# Patient Record
Sex: Female | Born: 1945
Health system: Southern US, Community
[De-identification: ages and names within clinical notes are randomized; demographics above are authoritative.]

## PROBLEM LIST (undated history)

## (undated) DIAGNOSIS — M810 Age-related osteoporosis without current pathological fracture: Secondary | ICD-10-CM

## (undated) DIAGNOSIS — H409 Unspecified glaucoma: Secondary | ICD-10-CM

## (undated) DIAGNOSIS — J449 Chronic obstructive pulmonary disease, unspecified: Secondary | ICD-10-CM

## (undated) DIAGNOSIS — I10 Essential (primary) hypertension: Secondary | ICD-10-CM

## (undated) HISTORY — PX: VAGINAL HYSTERECTOMY: SUR661

## (undated) HISTORY — DX: Chronic obstructive pulmonary disease, unspecified: J44.9

## (undated) HISTORY — DX: Unspecified glaucoma: H40.9

## (undated) HISTORY — DX: Age-related osteoporosis without current pathological fracture: M81.0

## (undated) HISTORY — DX: Essential (primary) hypertension: I10

## (undated) HISTORY — PX: FOOT SURGERY: SHX648

---

## 2007-01-24 ENCOUNTER — Ambulatory Visit: Payer: Self-pay | Admitting: Family Medicine

## 2007-01-30 ENCOUNTER — Ambulatory Visit: Payer: Self-pay | Admitting: Family Medicine

## 2008-04-20 ENCOUNTER — Ambulatory Visit: Payer: Self-pay | Admitting: Family Medicine

## 2008-09-15 ENCOUNTER — Ambulatory Visit: Payer: Self-pay | Admitting: Family Medicine

## 2009-10-07 ENCOUNTER — Ambulatory Visit: Payer: Self-pay | Admitting: Family Medicine

## 2011-05-25 ENCOUNTER — Ambulatory Visit: Payer: Self-pay | Admitting: Family Medicine

## 2011-10-02 DIAGNOSIS — H4011X0 Primary open-angle glaucoma, stage unspecified: Secondary | ICD-10-CM | POA: Diagnosis not present

## 2012-02-15 DIAGNOSIS — Z23 Encounter for immunization: Secondary | ICD-10-CM | POA: Diagnosis not present

## 2012-02-15 DIAGNOSIS — M810 Age-related osteoporosis without current pathological fracture: Secondary | ICD-10-CM | POA: Diagnosis not present

## 2012-02-15 DIAGNOSIS — I10 Essential (primary) hypertension: Secondary | ICD-10-CM | POA: Diagnosis not present

## 2012-02-15 DIAGNOSIS — J449 Chronic obstructive pulmonary disease, unspecified: Secondary | ICD-10-CM | POA: Diagnosis not present

## 2012-02-15 DIAGNOSIS — J309 Allergic rhinitis, unspecified: Secondary | ICD-10-CM | POA: Diagnosis not present

## 2012-03-28 DIAGNOSIS — H4011X0 Primary open-angle glaucoma, stage unspecified: Secondary | ICD-10-CM | POA: Diagnosis not present

## 2012-09-26 DIAGNOSIS — H4011X0 Primary open-angle glaucoma, stage unspecified: Secondary | ICD-10-CM | POA: Diagnosis not present

## 2012-10-23 DIAGNOSIS — T1500XA Foreign body in cornea, unspecified eye, initial encounter: Secondary | ICD-10-CM | POA: Diagnosis not present

## 2012-10-23 DIAGNOSIS — H5710 Ocular pain, unspecified eye: Secondary | ICD-10-CM | POA: Diagnosis not present

## 2012-11-08 DIAGNOSIS — J309 Allergic rhinitis, unspecified: Secondary | ICD-10-CM | POA: Diagnosis not present

## 2012-11-08 DIAGNOSIS — M810 Age-related osteoporosis without current pathological fracture: Secondary | ICD-10-CM | POA: Diagnosis not present

## 2012-11-08 DIAGNOSIS — I10 Essential (primary) hypertension: Secondary | ICD-10-CM | POA: Diagnosis not present

## 2012-11-08 DIAGNOSIS — R197 Diarrhea, unspecified: Secondary | ICD-10-CM | POA: Diagnosis not present

## 2012-11-08 DIAGNOSIS — J449 Chronic obstructive pulmonary disease, unspecified: Secondary | ICD-10-CM | POA: Diagnosis not present

## 2012-11-15 DIAGNOSIS — J328 Other chronic sinusitis: Secondary | ICD-10-CM | POA: Diagnosis not present

## 2012-11-15 DIAGNOSIS — R0982 Postnasal drip: Secondary | ICD-10-CM | POA: Diagnosis not present

## 2012-11-15 DIAGNOSIS — J301 Allergic rhinitis due to pollen: Secondary | ICD-10-CM | POA: Diagnosis not present

## 2012-11-15 DIAGNOSIS — H6120 Impacted cerumen, unspecified ear: Secondary | ICD-10-CM | POA: Diagnosis not present

## 2013-01-15 DIAGNOSIS — Z23 Encounter for immunization: Secondary | ICD-10-CM | POA: Diagnosis not present

## 2013-03-27 DIAGNOSIS — H4011X0 Primary open-angle glaucoma, stage unspecified: Secondary | ICD-10-CM | POA: Diagnosis not present

## 2013-04-22 DIAGNOSIS — H2510 Age-related nuclear cataract, unspecified eye: Secondary | ICD-10-CM | POA: Diagnosis not present

## 2013-07-24 DIAGNOSIS — Z0000 Encounter for general adult medical examination without abnormal findings: Secondary | ICD-10-CM | POA: Diagnosis not present

## 2013-07-24 DIAGNOSIS — I10 Essential (primary) hypertension: Secondary | ICD-10-CM | POA: Diagnosis not present

## 2013-07-24 DIAGNOSIS — R634 Abnormal weight loss: Secondary | ICD-10-CM | POA: Diagnosis not present

## 2013-07-24 DIAGNOSIS — M810 Age-related osteoporosis without current pathological fracture: Secondary | ICD-10-CM | POA: Diagnosis not present

## 2013-07-24 DIAGNOSIS — J449 Chronic obstructive pulmonary disease, unspecified: Secondary | ICD-10-CM | POA: Diagnosis not present

## 2013-07-24 DIAGNOSIS — R109 Unspecified abdominal pain: Secondary | ICD-10-CM | POA: Diagnosis not present

## 2013-08-08 ENCOUNTER — Ambulatory Visit: Payer: Self-pay | Admitting: Family Medicine

## 2013-08-08 DIAGNOSIS — R634 Abnormal weight loss: Secondary | ICD-10-CM | POA: Diagnosis not present

## 2013-08-08 DIAGNOSIS — N200 Calculus of kidney: Secondary | ICD-10-CM | POA: Diagnosis not present

## 2013-08-08 DIAGNOSIS — N949 Unspecified condition associated with female genital organs and menstrual cycle: Secondary | ICD-10-CM | POA: Diagnosis not present

## 2013-08-08 DIAGNOSIS — Z90710 Acquired absence of both cervix and uterus: Secondary | ICD-10-CM | POA: Diagnosis not present

## 2013-08-08 DIAGNOSIS — R109 Unspecified abdominal pain: Secondary | ICD-10-CM | POA: Diagnosis not present

## 2013-09-04 ENCOUNTER — Ambulatory Visit: Payer: Self-pay | Admitting: Family Medicine

## 2013-09-04 DIAGNOSIS — J449 Chronic obstructive pulmonary disease, unspecified: Secondary | ICD-10-CM | POA: Diagnosis not present

## 2013-09-04 DIAGNOSIS — R911 Solitary pulmonary nodule: Secondary | ICD-10-CM | POA: Diagnosis not present

## 2013-09-04 DIAGNOSIS — R634 Abnormal weight loss: Secondary | ICD-10-CM | POA: Diagnosis not present

## 2013-09-04 DIAGNOSIS — A048 Other specified bacterial intestinal infections: Secondary | ICD-10-CM | POA: Diagnosis not present

## 2013-09-04 DIAGNOSIS — R109 Unspecified abdominal pain: Secondary | ICD-10-CM | POA: Diagnosis not present

## 2013-09-09 DIAGNOSIS — R109 Unspecified abdominal pain: Secondary | ICD-10-CM | POA: Diagnosis not present

## 2013-09-09 DIAGNOSIS — R634 Abnormal weight loss: Secondary | ICD-10-CM | POA: Insufficient documentation

## 2013-09-09 DIAGNOSIS — Z720 Tobacco use: Secondary | ICD-10-CM | POA: Insufficient documentation

## 2013-09-09 DIAGNOSIS — R911 Solitary pulmonary nodule: Secondary | ICD-10-CM | POA: Insufficient documentation

## 2013-09-09 DIAGNOSIS — R933 Abnormal findings on diagnostic imaging of other parts of digestive tract: Secondary | ICD-10-CM | POA: Diagnosis not present

## 2013-09-25 DIAGNOSIS — H4011X0 Primary open-angle glaucoma, stage unspecified: Secondary | ICD-10-CM | POA: Diagnosis not present

## 2014-01-23 DIAGNOSIS — F17211 Nicotine dependence, cigarettes, in remission: Secondary | ICD-10-CM | POA: Diagnosis not present

## 2014-01-23 DIAGNOSIS — J449 Chronic obstructive pulmonary disease, unspecified: Secondary | ICD-10-CM | POA: Diagnosis not present

## 2014-01-23 DIAGNOSIS — M810 Age-related osteoporosis without current pathological fracture: Secondary | ICD-10-CM | POA: Diagnosis not present

## 2014-01-23 DIAGNOSIS — I10 Essential (primary) hypertension: Secondary | ICD-10-CM | POA: Diagnosis not present

## 2014-01-30 DIAGNOSIS — Z23 Encounter for immunization: Secondary | ICD-10-CM | POA: Diagnosis not present

## 2014-03-26 DIAGNOSIS — H4011X1 Primary open-angle glaucoma, mild stage: Secondary | ICD-10-CM | POA: Diagnosis not present

## 2014-08-10 DIAGNOSIS — J42 Unspecified chronic bronchitis: Secondary | ICD-10-CM | POA: Diagnosis not present

## 2014-08-10 DIAGNOSIS — J441 Chronic obstructive pulmonary disease with (acute) exacerbation: Secondary | ICD-10-CM | POA: Diagnosis not present

## 2014-08-10 DIAGNOSIS — M810 Age-related osteoporosis without current pathological fracture: Secondary | ICD-10-CM | POA: Diagnosis not present

## 2014-08-10 DIAGNOSIS — I10 Essential (primary) hypertension: Secondary | ICD-10-CM | POA: Diagnosis not present

## 2014-10-08 ENCOUNTER — Other Ambulatory Visit: Payer: Self-pay | Admitting: Family Medicine

## 2014-10-08 DIAGNOSIS — J439 Emphysema, unspecified: Secondary | ICD-10-CM

## 2014-10-09 DIAGNOSIS — H2513 Age-related nuclear cataract, bilateral: Secondary | ICD-10-CM | POA: Diagnosis not present

## 2014-10-09 DIAGNOSIS — H4011X4 Primary open-angle glaucoma, indeterminate stage: Secondary | ICD-10-CM | POA: Diagnosis not present

## 2014-11-13 DIAGNOSIS — H4011X4 Primary open-angle glaucoma, indeterminate stage: Secondary | ICD-10-CM | POA: Diagnosis not present

## 2014-11-13 DIAGNOSIS — H2513 Age-related nuclear cataract, bilateral: Secondary | ICD-10-CM | POA: Diagnosis not present

## 2015-01-25 ENCOUNTER — Encounter: Payer: Self-pay | Admitting: Family Medicine

## 2015-01-25 ENCOUNTER — Ambulatory Visit (INDEPENDENT_AMBULATORY_CARE_PROVIDER_SITE_OTHER): Payer: Medicare Other | Admitting: Family Medicine

## 2015-01-25 VITALS — BP 100/80 | HR 80 | Ht 65.0 in | Wt 83.0 lb

## 2015-01-25 DIAGNOSIS — J432 Centrilobular emphysema: Secondary | ICD-10-CM

## 2015-01-25 DIAGNOSIS — M810 Age-related osteoporosis without current pathological fracture: Secondary | ICD-10-CM | POA: Diagnosis not present

## 2015-01-25 DIAGNOSIS — I10 Essential (primary) hypertension: Secondary | ICD-10-CM

## 2015-01-25 DIAGNOSIS — J439 Emphysema, unspecified: Secondary | ICD-10-CM | POA: Diagnosis not present

## 2015-01-25 MED ORDER — ALENDRONATE SODIUM 70 MG PO TABS: 70.0000 mg | ORAL_TABLET | ORAL | Status: DC

## 2015-01-25 MED ORDER — ALBUTEROL SULFATE HFA 108 (90 BASE) MCG/ACT IN AERS: 2.0000 | INHALATION_SPRAY | RESPIRATORY_TRACT | Status: DC | PRN

## 2015-01-25 MED ORDER — HYDROCHLOROTHIAZIDE 12.5 MG PO TABS: 12.5000 mg | ORAL_TABLET | Freq: Every day | ORAL | Status: DC

## 2015-01-25 MED ORDER — BUDESONIDE-FORMOTEROL FUMARATE 160-4.5 MCG/ACT IN AERO: 1.0000 | INHALATION_SPRAY | Freq: Every day | RESPIRATORY_TRACT | Status: DC

## 2015-01-25 MED ORDER — TIOTROPIUM BROMIDE MONOHYDRATE 18 MCG IN CAPS
ORAL_CAPSULE | RESPIRATORY_TRACT | Status: DC
Start: 2015-01-25 — End: 2015-07-09

## 2015-01-25 NOTE — Progress Notes (Signed)
Name: Nichole Henry   MRN: [id number]    DOB: November 22, 1945   Date:01/25/2015       Progress Note  Subjective  Chief Complaint  Chief Complaint  Patient presents with  . Hypertension  . COPD  . Osteoporosis    Hypertension This is a chronic problem. The current episode started more than 1 year ago. The problem has been gradually improving since onset. The problem is controlled. Pertinent negatives include no anxiety, blurred vision, chest pain, headaches, malaise/fatigue, neck pain, orthopnea, palpitations, peripheral edema, PND, shortness of breath or sweats. There are no associated agents to hypertension. Risk factors for coronary artery disease include smoking/tobacco exposure. Past treatments include diuretics. The current treatment provides moderate improvement. There are no compliance problems.  There is no history of angina, kidney disease, CAD/MI, CVA, heart failure, left ventricular hypertrophy, PVD, renovascular disease or retinopathy. There is no history of chronic renal disease.  Shortness of Breath This is a chronic problem. The current episode started in the past 7 days. The problem occurs intermittently. Pertinent negatives include no abdominal pain, chest pain, claudication, coryza, ear pain, fever, headaches, hemoptysis, leg pain, leg swelling, neck pain, orthopnea, PND, rash, rhinorrhea, sore throat, sputum production, swollen glands, syncope, vomiting or wheezing. The symptoms are aggravated by pollens and weather changes. She has tried beta agonist inhalers and steroid inhalers for the symptoms. The treatment provided mild relief. Her past medical history is significant for allergies. There is no history of aspirin allergies, asthma, bronchiolitis, CAD, chronic lung disease, COPD, DVT, a heart failure, PE, pneumonia or a recent surgery.    No problem-specific assessment & plan notes found for this encounter.   Past Medical History  Diagnosis Date  . Hypertension   .  Osteoporosis   . Glaucoma   . COPD (chronic obstructive pulmonary disease) [facility])     Past Surgical History  Procedure Laterality Date  . Vaginal hysterectomy    . Foot surgery Right     realigned foot     Family History  Problem Relation Age of Onset  . Hypertension Mother   . Stroke Mother     Social History   Social History  . Marital Status: Single    Spouse Name: N/A  . Number of Children: N/A  . Years of Education: N/A   Occupational History  . Not on file.   Social History Main Topics  . Smoking status: Current Every Day Smoker  . Smokeless tobacco: Not on file  . Alcohol Use: No  . Drug Use: No  . Sexual Activity: Not Currently   Other Topics Concern  . Not on file   Social History Narrative  . No narrative on file    No Known Allergies   Review of Systems  Constitutional: Negative for fever, chills, weight loss and malaise/fatigue.  HENT: Negative for ear discharge, ear pain, rhinorrhea and sore throat.   Eyes: Negative for blurred vision.  Respiratory: Negative for cough, hemoptysis, sputum production, shortness of breath and wheezing.   Cardiovascular: Negative for chest pain, palpitations, orthopnea, claudication, leg swelling, syncope and PND.  Gastrointestinal: Negative for heartburn, nausea, vomiting, abdominal pain, diarrhea, constipation, blood in stool and melena.  Genitourinary: Negative for dysuria, urgency, frequency and hematuria.  Musculoskeletal: Negative for myalgias, back pain, joint pain and neck pain.  Skin: Negative for rash.  Neurological: Negative for dizziness, tingling, sensory change, focal weakness and headaches.  Endo/Heme/Allergies: Negative for environmental allergies and polydipsia. Does not bruise/bleed easily.  Psychiatric/Behavioral:  Negative for depression and suicidal ideas. The patient is not nervous/anxious and does not have insomnia.      Objective  Filed Vitals:   01/25/15 1447  BP: 100/80  Pulse: 80   Height: 5\' 5"  (1.651 m)  Weight: 83 lb (37.649 kg)    Physical Exam  Constitutional: She is well-developed, well-nourished, and in no distress. No distress.  HENT:  Head: Normocephalic and atraumatic.  Right Ear: External ear normal.  Left Ear: External ear normal.  Nose: Nose normal.  Mouth/Throat: Oropharynx is clear and moist.  Eyes: Conjunctivae and EOM are normal. Pupils are equal, round, and reactive to light. Right eye exhibits no discharge. Left eye exhibits no discharge.  Neck: Normal range of motion. Neck supple. No JVD present. No thyromegaly present.  Cardiovascular: Normal rate, regular rhythm, normal heart sounds and intact distal pulses.  Exam reveals no gallop and no friction rub.   No murmur heard. Pulmonary/Chest: Effort normal and breath sounds normal.  Abdominal: Soft. Bowel sounds are normal. She exhibits no mass. There is no tenderness. There is no guarding.  Musculoskeletal: Normal range of motion. She exhibits no edema.  Lymphadenopathy:    She has no cervical adenopathy.  Neurological: She is alert. She has normal reflexes.  Skin: Skin is warm and dry. She is not diaphoretic.  Psychiatric: Mood and affect normal.      Assessment & Plan  Problem List Items Addressed This Visit      Cardiovascular and Mediastinum   Essential hypertension - Primary   Relevant Medications   hydrochlorothiazide (HYDRODIURIL) 12.5 MG tablet   Other Relevant Orders   Renal Function Panel     Respiratory   Centrilobular emphysema (HCC)   Relevant Medications   promethazine (PHENERGAN) 25 MG tablet   albuterol (PROAIR HFA) 108 (90 BASE) MCG/ACT inhaler   budesonide-formoterol (SYMBICORT) 160-4.5 MCG/ACT inhaler   tiotropium (SPIRIVA HANDIHALER) 18 MCG inhalation capsule     Musculoskeletal and Integument   Osteoporosis   Relevant Medications   alendronate (FOSAMAX) 70 MG tablet    Other Visit Diagnoses    Pulmonary emphysema, unspecified emphysema type (HCC)         Relevant Medications    promethazine (PHENERGAN) 25 MG tablet    albuterol (PROAIR HFA) 108 (90 BASE) MCG/ACT inhaler    budesonide-formoterol (SYMBICORT) 160-4.5 MCG/ACT inhaler    tiotropium (SPIRIVA HANDIHALER) 18 MCG inhalation capsule         Dr. Hayden Rasmusseneanna [facility]  01/25/2015

## 2015-01-26 LAB — RENAL FUNCTION PANEL
ALBUMIN: 4.6 g/dL (ref 3.6–4.8)
BUN/Creatinine Ratio: 21 (ref 11–26)
BUN: 12 mg/dL (ref 8–27)
CO2: 26 mmol/L (ref 18–29)
CREATININE: 0.58 mg/dL (ref 0.57–1.00)
Calcium: 9.9 mg/dL (ref 8.7–10.3)
Chloride: 99 mmol/L (ref 97–106)
GFR calc Af Amer: 109 mL/min/{1.73_m2} (ref >59)
GFR, EST NON AFRICAN AMERICAN: 94 mL/min/{1.73_m2} (ref >59)
GLUCOSE: 78 mg/dL (ref 65–99)
PHOSPHORUS: 3.8 mg/dL (ref 2.5–4.5)
POTASSIUM: 4.2 mmol/L (ref 3.5–5.2)
Sodium: 139 mmol/L (ref 136–144)

## 2015-01-29 MED ORDER — BUDESONIDE-FORMOTEROL FUMARATE 160-4.5 MCG/ACT IN AERO: 2.0000 | INHALATION_SPRAY | Freq: Every day | RESPIRATORY_TRACT | Status: DC

## 2015-01-29 NOTE — Addendum Note (Signed)
Addended by: Everitt AmberLYNCH, Dashon Mcintire L on: 01/29/2015 01:53 PM   Modules accepted: Orders

## 2015-02-10 DIAGNOSIS — H2513 Age-related nuclear cataract, bilateral: Secondary | ICD-10-CM | POA: Diagnosis not present

## 2015-02-10 DIAGNOSIS — H401131 Primary open-angle glaucoma, bilateral, mild stage: Secondary | ICD-10-CM | POA: Diagnosis not present

## 2015-02-12 DIAGNOSIS — Z23 Encounter for immunization: Secondary | ICD-10-CM | POA: Diagnosis not present

## 2015-06-09 DIAGNOSIS — H401131 Primary open-angle glaucoma, bilateral, mild stage: Secondary | ICD-10-CM | POA: Diagnosis not present

## 2015-06-09 DIAGNOSIS — H2513 Age-related nuclear cataract, bilateral: Secondary | ICD-10-CM | POA: Diagnosis not present

## 2015-07-09 ENCOUNTER — Ambulatory Visit (INDEPENDENT_AMBULATORY_CARE_PROVIDER_SITE_OTHER): Payer: Medicare Other | Admitting: Family Medicine

## 2015-07-09 ENCOUNTER — Ambulatory Visit
Admission: RE | Admit: 2015-07-09 | Discharge: 2015-07-09 | Disposition: A | Discharge status: Home / Self Care | Payer: Medicare Other | Source: Ambulatory Visit | Attending: Family Medicine | Admitting: Family Medicine

## 2015-07-09 ENCOUNTER — Encounter: Payer: Self-pay | Admitting: Family Medicine

## 2015-07-09 VITALS — BP 138/80 | HR 119 | Ht 64.0 in | Wt 82.0 lb

## 2015-07-09 DIAGNOSIS — I10 Essential (primary) hypertension: Secondary | ICD-10-CM | POA: Diagnosis not present

## 2015-07-09 DIAGNOSIS — Z720 Tobacco use: Secondary | ICD-10-CM

## 2015-07-09 DIAGNOSIS — J439 Emphysema, unspecified: Secondary | ICD-10-CM | POA: Insufficient documentation

## 2015-07-09 DIAGNOSIS — J40 Bronchitis, not specified as acute or chronic: Secondary | ICD-10-CM | POA: Insufficient documentation

## 2015-07-09 DIAGNOSIS — J432 Centrilobular emphysema: Secondary | ICD-10-CM

## 2015-07-09 DIAGNOSIS — R0602 Shortness of breath: Secondary | ICD-10-CM | POA: Diagnosis not present

## 2015-07-09 DIAGNOSIS — R0600 Dyspnea, unspecified: Secondary | ICD-10-CM | POA: Diagnosis present

## 2015-07-09 DIAGNOSIS — Z9189 Other specified personal risk factors, not elsewhere classified: Secondary | ICD-10-CM

## 2015-07-09 DIAGNOSIS — Z87898 Personal history of other specified conditions: Secondary | ICD-10-CM

## 2015-07-09 DIAGNOSIS — J984 Other disorders of lung: Secondary | ICD-10-CM | POA: Insufficient documentation

## 2015-07-09 DIAGNOSIS — J449 Chronic obstructive pulmonary disease, unspecified: Secondary | ICD-10-CM | POA: Diagnosis not present

## 2015-07-09 MED ORDER — AZITHROMYCIN 250 MG PO TABS: ORAL_TABLET | ORAL | Status: DC

## 2015-07-09 MED ORDER — BUDESONIDE-FORMOTEROL FUMARATE 160-4.5 MCG/ACT IN AERO: 2.0000 | INHALATION_SPRAY | Freq: Every day | RESPIRATORY_TRACT | Status: DC

## 2015-07-09 MED ORDER — TIOTROPIUM BROMIDE MONOHYDRATE 18 MCG IN CAPS: ORAL_CAPSULE | RESPIRATORY_TRACT | Status: DC

## 2015-07-09 MED ORDER — ALBUTEROL SULFATE HFA 108 (90 BASE) MCG/ACT IN AERS: 2.0000 | INHALATION_SPRAY | RESPIRATORY_TRACT | Status: DC | PRN

## 2015-07-09 MED ORDER — HYDROCHLOROTHIAZIDE 12.5 MG PO TABS: 12.5000 mg | ORAL_TABLET | Freq: Every day | ORAL | Status: DC

## 2015-07-09 NOTE — Progress Notes (Signed)
Name: Nichole Henry   MRN: [id number]    DOB: [birth year]   Date:07/09/2015       Progress Note  Subjective  Chief Complaint  Chief Complaint  Patient presents with  . COPD  . Hypertension    Hypertension This is a chronic problem. The current episode started more than 1 year ago. The problem has been gradually worsening since onset. The problem is controlled. Pertinent negatives include no anxiety, blurred vision, chest pain, headaches, malaise/fatigue, neck pain, orthopnea, palpitations, peripheral edema, PND, shortness of breath or sweats. There are no associated agents to hypertension. There are no known risk factors for coronary artery disease. Past treatments include diuretics. The current treatment provides mild improvement. There is no history of angina, kidney disease, CAD/MI, CVA, heart failure, left ventricular hypertrophy, PVD, renovascular disease or retinopathy. There is no history of chronic renal disease or a hypertension causing med.  Shortness of Breath This is a chronic problem. The current episode started more than 1 year ago. The problem occurs daily. Associated symptoms include sputum production. Pertinent negatives include no abdominal pain, chest pain, ear pain, fever, headaches, hemoptysis, leg swelling, neck pain, orthopnea, PND, rash, sore throat or wheezing. Nothing aggravates the symptoms. She has tried nothing for the symptoms. The treatment provided mild relief. There is no history of asthma, bronchiolitis, CAD, COPD, DVT, a heart failure, PE or pneumonia.    No problem-specific assessment & plan notes found for this encounter.   Past Medical History  Diagnosis Date  . Hypertension   . Osteoporosis   . Glaucoma   . COPD (chronic obstructive pulmonary disease) [facility](HCC)     Past Surgical History  Procedure Laterality Date  . Vaginal hysterectomy    . Foot surgery Right     realigned foot     Family History  Problem Relation Age of Onset  . Hypertension  Mother   . Stroke Mother     Social History   Social History  . Marital Status: Single    Spouse Name: N/A  . Number of Children: N/A  . Years of Education: N/A   Occupational History  . Not on file.   Social History Main Topics  . Smoking status: Current Every Day Smoker  . Smokeless tobacco: Not on file  . Alcohol Use: No  . Drug Use: No  . Sexual Activity: Not Currently   Other Topics Concern  . Not on file   Social History Narrative    No Known Allergies   Review of Systems  Constitutional: Negative for fever, chills, weight loss and malaise/fatigue.  HENT: Negative for ear discharge, ear pain and sore throat.   Eyes: Negative for blurred vision.  Respiratory: Positive for sputum production. Negative for cough, hemoptysis, shortness of breath and wheezing.   Cardiovascular: Negative for chest pain, palpitations, orthopnea, leg swelling and PND.  Gastrointestinal: Negative for heartburn, nausea, abdominal pain, diarrhea, constipation, blood in stool and melena.  Genitourinary: Negative for dysuria, urgency, frequency and hematuria.  Musculoskeletal: Negative for myalgias, back pain, joint pain and neck pain.  Skin: Negative for rash.  Neurological: Negative for dizziness, tingling, sensory change, focal weakness and headaches.  Endo/Heme/Allergies: Negative for environmental allergies and polydipsia. Does not bruise/bleed easily.  Psychiatric/Behavioral: Negative for depression and suicidal ideas. The patient is not nervous/anxious and does not have insomnia.      Objective  Filed Vitals:   07/09/15 1343  BP: 138/80  Pulse: 119  Height: 5\' 4"  (1.626 m)  Weight:  82 lb (37.195 kg)  SpO2: 98%    Physical Exam  Constitutional: She is well-developed, well-nourished, and in no distress. No distress.  HENT:  Head: Normocephalic and atraumatic.  Right Ear: External ear normal.  Left Ear: External ear normal.  Nose: Nose normal.  Mouth/Throat: Oropharynx is  clear and moist.  Eyes: Conjunctivae and EOM are normal. Pupils are equal, round, and reactive to light. Right eye exhibits no discharge. Left eye exhibits no discharge.  Neck: Normal range of motion. Neck supple. No JVD present. No thyromegaly present.  Cardiovascular: Normal rate, regular rhythm, normal heart sounds and intact distal pulses.  Exam reveals no gallop and no friction rub.   No murmur heard. Pulmonary/Chest: Effort normal and breath sounds normal.  Abdominal: Soft. Bowel sounds are normal. She exhibits no mass. There is no tenderness. There is no guarding.  Musculoskeletal: Normal range of motion. She exhibits no edema.  Lymphadenopathy:    She has no cervical adenopathy.  Neurological: She is alert. She has normal reflexes.  Skin: Skin is warm and dry. She is not diaphoretic.  Psychiatric: Mood and affect normal.  Nursing note and vitals reviewed.     Assessment & Plan  Problem List Items Addressed This Visit      Cardiovascular and Mediastinum   Essential hypertension - Primary   Relevant Medications   hydrochlorothiazide (HYDRODIURIL) 12.5 MG tablet   Other Relevant Orders   DG Chest 2 View (Completed)     Respiratory   Centrilobular emphysema (HCC)   Relevant Medications   albuterol (PROAIR HFA) 108 (90 Base) MCG/ACT inhaler   budesonide-formoterol (SYMBICORT) 160-4.5 MCG/ACT inhaler   tiotropium (SPIRIVA HANDIHALER) 18 MCG inhalation capsule   azithromycin (ZITHROMAX) 250 MG tablet   Other Relevant Orders   DG Chest 2 View (Completed)     Other   Current tobacco use   Relevant Orders   DG Chest 2 View (Completed)    Other Visit Diagnoses    Bronchitis        Relevant Orders    DG Chest 2 View (Completed)    At risk for noncompliance        Relevant Orders    DG Chest 2 View (Completed)    Pulmonary emphysema, unspecified emphysema type (HCC)        Relevant Medications    albuterol (PROAIR HFA) 108 (90 Base) MCG/ACT inhaler     budesonide-formoterol (SYMBICORT) 160-4.5 MCG/ACT inhaler    tiotropium (SPIRIVA HANDIHALER) 18 MCG inhalation capsule    azithromycin (ZITHROMAX) 250 MG tablet    Other Relevant Orders    DG Chest 2 View (Completed)         Dr. [facility]  07/09/2015

## 2015-09-19 ENCOUNTER — Other Ambulatory Visit: Payer: Self-pay | Admitting: Family Medicine

## 2015-09-29 DIAGNOSIS — H401131 Primary open-angle glaucoma, bilateral, mild stage: Secondary | ICD-10-CM | POA: Diagnosis not present

## 2015-09-29 DIAGNOSIS — H2513 Age-related nuclear cataract, bilateral: Secondary | ICD-10-CM | POA: Diagnosis not present

## 2016-01-19 DIAGNOSIS — Z23 Encounter for immunization: Secondary | ICD-10-CM | POA: Diagnosis not present

## 2016-01-31 ENCOUNTER — Encounter: Payer: Self-pay | Admitting: Family Medicine

## 2016-01-31 ENCOUNTER — Ambulatory Visit (INDEPENDENT_AMBULATORY_CARE_PROVIDER_SITE_OTHER): Payer: Medicare Other | Admitting: Family Medicine

## 2016-01-31 VITALS — BP 102/80 | HR 84 | Ht 64.0 in | Wt 82.0 lb

## 2016-01-31 DIAGNOSIS — I10 Essential (primary) hypertension: Secondary | ICD-10-CM

## 2016-01-31 DIAGNOSIS — J0100 Acute maxillary sinusitis, unspecified: Secondary | ICD-10-CM | POA: Diagnosis not present

## 2016-01-31 DIAGNOSIS — J432 Centrilobular emphysema: Secondary | ICD-10-CM | POA: Diagnosis not present

## 2016-01-31 DIAGNOSIS — J439 Emphysema, unspecified: Secondary | ICD-10-CM

## 2016-01-31 MED ORDER — ALBUTEROL SULFATE HFA 108 (90 BASE) MCG/ACT IN AERS
2.0000 | INHALATION_SPRAY | RESPIRATORY_TRACT | 6 refills | Status: DC | PRN
Start: 2016-01-31 — End: 2016-05-02

## 2016-01-31 MED ORDER — HYDROCHLOROTHIAZIDE 12.5 MG PO TABS: 12.5000 mg | ORAL_TABLET | Freq: Every day | ORAL | 6 refills | Status: DC

## 2016-01-31 MED ORDER — TIOTROPIUM BROMIDE MONOHYDRATE 18 MCG IN CAPS: ORAL_CAPSULE | RESPIRATORY_TRACT | 6 refills | Status: DC

## 2016-01-31 MED ORDER — AZITHROMYCIN 250 MG PO TABS
ORAL_TABLET | ORAL | 0 refills | Status: DC
Start: 2016-01-31 — End: 2016-07-11

## 2016-01-31 MED ORDER — BUDESONIDE-FORMOTEROL FUMARATE 160-4.5 MCG/ACT IN AERO
2.0000 | INHALATION_SPRAY | Freq: Every day | RESPIRATORY_TRACT | 6 refills | Status: DC
Start: 2016-01-31 — End: 2016-07-24

## 2016-01-31 NOTE — Progress Notes (Signed)
Name: Nichole Henry   MRN: [id number]    DOB: [birth year]   Date:01/31/2016       Progress Note  Subjective  Chief Complaint  Chief Complaint  Patient presents with  . COPD  . Hypertension    Hypertension  This is a chronic problem. The current episode started more than 1 year ago. The problem has been waxing and waning since onset. The problem is controlled. Pertinent negatives include no anxiety, blurred vision, chest pain, headaches, malaise/fatigue, neck pain, orthopnea, palpitations, peripheral edema, PND, shortness of breath or sweats. There are no associated agents to hypertension. There are no known risk factors for coronary artery disease. Past treatments include diuretics. The current treatment provides moderate improvement. Compliance problems include diet.  There is no history of angina, CVA, heart failure, left ventricular hypertrophy, PVD, renovascular disease or retinopathy. There is no history of chronic renal disease or a hypertension causing med.    No problem-specific Assessment & Plan notes found for this encounter.   Past Medical History:  Diagnosis Date  . COPD (chronic obstructive pulmonary disease) (HCC)   . Glaucoma   . Hypertension   . Osteoporosis     Past Surgical History:  Procedure Laterality Date  . FOOT SURGERY Right    realigned foot   . VAGINAL HYSTERECTOMY      Family History  Problem Relation Age of Onset  . Hypertension Mother   . Stroke Mother     Social History   Social History  . Marital status: Single    Spouse name: N/A  . Number of children: N/A  . Years of education: N/A   Occupational History  . Not on file.   Social History Main Topics  . Smoking status: Current Every Day Smoker  . Smokeless tobacco: Not on file  . Alcohol use No  . Drug use: No  . Sexual activity: Not Currently   Other Topics Concern  . Not on file   Social History Narrative  . No narrative on file    No Known Allergies   Review of  Systems  Constitutional: Negative for chills, fever, malaise/fatigue and weight loss.  HENT: Negative for ear discharge, ear pain and sore throat.   Eyes: Negative for blurred vision.  Respiratory: Negative for cough, sputum production, shortness of breath and wheezing.   Cardiovascular: Negative for chest pain, palpitations, orthopnea, leg swelling and PND.  Gastrointestinal: Negative for abdominal pain, blood in stool, constipation, diarrhea, heartburn, melena and nausea.  Genitourinary: Negative for dysuria, frequency, hematuria and urgency.  Musculoskeletal: Negative for back pain, joint pain, myalgias and neck pain.  Skin: Negative for rash.  Neurological: Negative for dizziness, tingling, sensory change, focal weakness and headaches.  Endo/Heme/Allergies: Negative for environmental allergies and polydipsia. Does not bruise/bleed easily.  Psychiatric/Behavioral: Negative for depression and suicidal ideas. The patient is not nervous/anxious and does not have insomnia.      Objective  Vitals:   01/31/16 1354  BP: 102/80  Pulse: 84  Weight: 82 lb (37.2 kg)  Height: 5\' 4"  (1.626 m)    Physical Exam  Constitutional: She is well-developed, well-nourished, and in no distress. No distress.  HENT:  Head: Normocephalic and atraumatic.  Right Ear: External ear normal.  Left Ear: External ear normal.  Nose: Nose normal.  Mouth/Throat: Oropharynx is clear and moist.  Eyes: Conjunctivae and EOM are normal. Pupils are equal, round, and reactive to light. Right eye exhibits no discharge. Left eye exhibits no discharge.  Neck:  Normal range of motion. Neck supple. No JVD present. No thyromegaly present.  Cardiovascular: Normal rate, regular rhythm, normal heart sounds and intact distal pulses.  Exam reveals no gallop and no friction rub.   No murmur heard. Pulmonary/Chest: Effort normal and breath sounds normal. She has no wheezes. She has no rales.  Abdominal: Soft. Bowel sounds are  normal. She exhibits no mass. There is no tenderness. There is no guarding.  Musculoskeletal: Normal range of motion. She exhibits no edema.  Lymphadenopathy:    She has no cervical adenopathy.  Neurological: She is alert. She has normal reflexes.  Skin: Skin is warm and dry. She is not diaphoretic.  Psychiatric: Mood and affect normal.      Assessment & Plan  Problem List Items Addressed This Visit      Cardiovascular and Mediastinum   Essential hypertension - Primary   Relevant Medications   hydrochlorothiazide (HYDRODIURIL) 12.5 MG tablet   Other Relevant Orders   Renal Function Panel     Respiratory   Centrilobular emphysema (HCC)   Relevant Medications   tiotropium (SPIRIVA HANDIHALER) 18 MCG inhalation capsule   budesonide-formoterol (SYMBICORT) 160-4.5 MCG/ACT inhaler   albuterol (PROAIR HFA) 108 (90 Base) MCG/ACT inhaler   azithromycin (ZITHROMAX) 250 MG tablet    Other Visit Diagnoses    Pulmonary emphysema, unspecified emphysema type (HCC)       Relevant Medications   tiotropium (SPIRIVA HANDIHALER) 18 MCG inhalation capsule   budesonide-formoterol (SYMBICORT) 160-4.5 MCG/ACT inhaler   albuterol (PROAIR HFA) 108 (90 Base) MCG/ACT inhaler   azithromycin (ZITHROMAX) 250 MG tablet   Subacute maxillary sinusitis       Relevant Medications   azithromycin (ZITHROMAX) 250 MG tablet    I spent 25 minutes with this patient, More than 50% of that time was spent in face to face education, counseling and care coordination.    Dr. [facility]den [facility]  01/31/16

## 2016-02-01 LAB — RENAL FUNCTION PANEL
Albumin: 4.8 g/dL (ref 3.5–4.8)
BUN / CREAT RATIO: 20 (ref 12–28)
BUN: 12 mg/dL (ref 8–27)
CALCIUM: 10 mg/dL (ref 8.7–10.3)
CO2: 27 mmol/L (ref 18–29)
CREATININE: 0.59 mg/dL (ref 0.57–1.00)
Chloride: 94 mmol/L — ABNORMAL LOW (ref 96–106)
GFR calc Af Amer: 107 mL/min/{1.73_m2} (ref >59)
GFR, EST NON AFRICAN AMERICAN: 93 mL/min/{1.73_m2} (ref >59)
Glucose: 87 mg/dL (ref 65–99)
Phosphorus: 3.7 mg/dL (ref 2.5–4.5)
Potassium: 4 mmol/L (ref 3.5–5.2)
SODIUM: 137 mmol/L (ref 134–144)

## 2016-02-28 ENCOUNTER — Other Ambulatory Visit: Payer: Self-pay

## 2016-03-22 DIAGNOSIS — H401131 Primary open-angle glaucoma, bilateral, mild stage: Secondary | ICD-10-CM | POA: Diagnosis not present

## 2016-03-22 DIAGNOSIS — H2513 Age-related nuclear cataract, bilateral: Secondary | ICD-10-CM | POA: Diagnosis not present

## 2016-05-02 ENCOUNTER — Other Ambulatory Visit: Payer: Self-pay

## 2016-05-02 DIAGNOSIS — J441 Chronic obstructive pulmonary disease with (acute) exacerbation: Secondary | ICD-10-CM

## 2016-05-02 MED ORDER — ALBUTEROL SULFATE HFA 108 (90 BASE) MCG/ACT IN AERS: 2.0000 | INHALATION_SPRAY | Freq: Four times a day (QID) | RESPIRATORY_TRACT | 4 refills | Status: DC | PRN

## 2016-05-08 ENCOUNTER — Other Ambulatory Visit: Payer: Self-pay

## 2016-05-08 DIAGNOSIS — J441 Chronic obstructive pulmonary disease with (acute) exacerbation: Secondary | ICD-10-CM

## 2016-05-08 MED ORDER — ALBUTEROL SULFATE HFA 108 (90 BASE) MCG/ACT IN AERS: 1.0000 | INHALATION_SPRAY | RESPIRATORY_TRACT | 4 refills | Status: DC | PRN

## 2016-07-11 ENCOUNTER — Ambulatory Visit (INDEPENDENT_AMBULATORY_CARE_PROVIDER_SITE_OTHER): Payer: Medicare Other | Admitting: Family Medicine

## 2016-07-11 VITALS — BP 100/60 | HR 120 | Ht 64.0 in | Wt 81.0 lb

## 2016-07-11 DIAGNOSIS — B0222 Postherpetic trigeminal neuralgia: Secondary | ICD-10-CM | POA: Diagnosis not present

## 2016-07-11 MED ORDER — VALACYCLOVIR HCL 1 G PO TABS: 1000.0000 mg | ORAL_TABLET | Freq: Three times a day (TID) | ORAL | 0 refills | Status: DC

## 2016-07-11 MED ORDER — TRAMADOL HCL 50 MG PO TABS
50.0000 mg | ORAL_TABLET | Freq: Three times a day (TID) | ORAL | 0 refills | Status: AC | PRN
Start: 2016-07-11 — End: ?

## 2016-07-11 NOTE — Progress Notes (Signed)
Name: Nichole Henry   MRN: [id number]    DOB: Feb 05, 1946   Date:07/11/2016       Progress Note  Subjective  Chief Complaint  Chief Complaint  Patient presents with  . Ear Pain    R) ear hurting inside the ear "but not real deep"- started x 4 days ago- tried Aspirin and had a ZPack at home that she has started and is on day 3    Otalgia   There is pain in the right ear. This is a new problem. The current episode started in the past 7 days (Friday PM). The problem occurs constantly. The problem has been waxing and waning. There has been no fever (very little). The pain is at a severity of 8/10. Associated symptoms include headaches, hearing loss and a sore throat. Pertinent negatives include no abdominal pain, coughing, diarrhea, ear discharge, neck pain, rash, rhinorrhea or vomiting. Treatments tried: azithromycin. The treatment provided mild relief. There is no history of hearing loss.    No problem-specific Assessment & Plan notes found for this encounter.   Past Medical History:  Diagnosis Date  . COPD (chronic obstructive pulmonary disease) (HCC)   . Glaucoma   . Hypertension   . Osteoporosis     Past Surgical History:  Procedure Laterality Date  . FOOT SURGERY Right    realigned foot   . VAGINAL HYSTERECTOMY      Family History  Problem Relation Age of Onset  . Hypertension Mother   . Stroke Mother     Social History   Social History  . Marital status: Single    Spouse name: N/A  . Number of children: N/A  . Years of education: N/A   Occupational History  . Not on file.   Social History Main Topics  . Smoking status: Current Every Day Smoker  . Smokeless tobacco: Not on file  . Alcohol use No  . Drug use: No  . Sexual activity: Not Currently   Other Topics Concern  . Not on file   Social History Narrative  . No narrative on file    No Known Allergies  Outpatient Medications Prior to Visit  Medication Sig Dispense Refill  . albuterol (PROVENTIL  HFA;VENTOLIN HFA) 108 (90 Base) MCG/ACT inhaler Inhale 1 puff into the lungs every 4 (four) hours as needed for wheezing or shortness of breath. Pt said ins will not pay for proair anymore- switch to this in place of proair 2 Inhaler 4  . budesonide-formoterol (SYMBICORT) 160-4.5 MCG/ACT inhaler Inhale 2 puffs into the lungs daily. 1 Inhaler 6  . hydrochlorothiazide (HYDRODIURIL) 12.5 MG tablet Take 1 tablet (12.5 mg total) by mouth daily. 30 tablet 6  . latanoprost (XALATAN) 0.005 % ophthalmic solution Place 1 drop into both eyes at bedtime. Dr Richard Miu    . promethazine (PHENERGAN) 25 MG tablet Take 25 mg by mouth as needed.    . tiotropium (SPIRIVA HANDIHALER) 18 MCG inhalation capsule INHALE ONE CAPSULE BY MOUTH ONCE DAILY 30 capsule 6  . dorzolamide (TRUSOPT) 2 % ophthalmic solution Place 1 drop into both eyes 2 (two) times daily. Dr Richard Miu    . azithromycin (ZITHROMAX) 250 MG tablet As directed 6 tablet 0   No facility-administered medications prior to visit.     Review of Systems  Constitutional: Negative for chills, fever, malaise/fatigue and weight loss.  HENT: Positive for ear pain, hearing loss and sore throat. Negative for ear discharge and rhinorrhea.   Eyes: Negative for blurred vision.  Respiratory: Negative for cough, sputum production, shortness of breath and wheezing.   Cardiovascular: Negative for chest pain, palpitations and leg swelling.  Gastrointestinal: Negative for abdominal pain, blood in stool, constipation, diarrhea, heartburn, melena, nausea and vomiting.  Genitourinary: Negative for dysuria, frequency, hematuria and urgency.  Musculoskeletal: Negative for back pain, joint pain, myalgias and neck pain.  Skin: Negative for rash.  Neurological: Positive for headaches. Negative for dizziness, tingling, sensory change and focal weakness.  Endo/Heme/Allergies: Negative for environmental allergies and polydipsia. Does not bruise/bleed easily.  Psychiatric/Behavioral:  Negative for depression and suicidal ideas. The patient is not nervous/anxious and does not have insomnia.      Objective  Vitals:   07/11/16 1015  BP: 100/60  Pulse: (!) 120  Weight: 81 lb (36.7 kg)  Height:  (1.626 m)    Physical Exam  Constitutional: She is well-developed, well-nourished, and in no distress. No distress.  HENT:  Head: Normocephalic and atraumatic.  Right Ear: There is tenderness. Tympanic membrane is injected.  Left Ear: Tympanic membrane, external ear and ear canal normal.  Nose: Nose normal.  Mouth/Throat: Posterior oropharyngeal erythema present. No oropharyngeal exudate or posterior oropharyngeal edema.  Oral mucosal vesicle  Eyes: Conjunctivae and EOM are normal. Pupils are equal, round, and reactive to light. Right eye exhibits no discharge. Left eye exhibits no discharge.  Neck: Normal range of motion. Neck supple. No JVD present. No thyromegaly present.  Cardiovascular: Normal rate, regular rhythm, normal heart sounds and intact distal pulses.  Exam reveals no gallop and no friction rub.   No murmur heard. Pulmonary/Chest: Effort normal and breath sounds normal.  Abdominal: Soft. Bowel sounds are normal. She exhibits no mass. There is no tenderness. There is no guarding.  Musculoskeletal: Normal range of motion. She exhibits no edema.  Lymphadenopathy:    She has no cervical adenopathy.  Neurological: She is alert. She has normal reflexes.  Skin: Skin is warm and dry. Rash noted. Rash is vesicular. She is not diaphoretic.     Psychiatric: Mood and affect normal.  Nursing note and vitals reviewed.     Assessment & Plan  Problem List Items Addressed This Visit    None    Visit Diagnoses    Trigeminal herpes zoster    -  Primary   Relevant Medications   valACYclovir (VALTREX) 1000 MG tablet   traMADol (ULTRAM) 50 MG tablet      Meds ordered this encounter  Medications  . valACYclovir (VALTREX) 1000 MG tablet    Sig: Take 1 tablet  (1,000 mg total) by mouth 3 (three) times daily.    Dispense:  21 tablet    Refill:  0  . traMADol (ULTRAM) 50 MG tablet    Sig: Take 1 tablet (50 mg total) by mouth every 8 (eight) hours as needed.    Dispense:  30 tablet    Refill:  0      Dr. [facility]  07/11/16

## 2016-07-11 NOTE — Patient Instructions (Signed)
Shingles Shingles, which is also known as herpes zoster, is an infection that causes a painful skin rash and fluid-filled blisters. Shingles is not related to genital herpes, which is a sexually transmitted infection. Shingles only develops in people who:  Have had chickenpox.  Have received the chickenpox vaccine. (This is rare.)  What are the causes? Shingles is caused by varicella-zoster virus (VZV). This is the same virus that causes chickenpox. After exposure to VZV, the virus stays in the body in an inactive (dormant) state. Shingles develops if the virus reactivates. This can happen many years after the initial exposure to VZV. It is not known what causes this virus to reactivate. What increases the risk? People who have had chickenpox or received the chickenpox vaccine are at risk for shingles. Infection is more common in people who:  Are older than age 50.  Have a weakened defense (immune) system, such as those with HIV, AIDS, or cancer.  Are taking medicines that weaken the immune system, such as transplant medicines.  Are under great stress.  What are the signs or symptoms? Early symptoms of this condition include itching, tingling, and pain in an area on your skin. Pain may be described as burning, stabbing, or throbbing. A few days or weeks after symptoms start, a painful red rash appears, usually on one side of the body in a bandlike or beltlike pattern. The rash eventually turns into fluid-filled blisters that break open, scab over, and dry up in about 2-3 weeks. At any time during the infection, you may also develop:  A fever.  Chills.  A headache.  An upset stomach.  How is this diagnosed? This condition is diagnosed with a skin exam. Sometimes, skin or fluid samples are taken from the blisters before a diagnosis is made. These samples are examined under a microscope or sent to a lab for testing. How is this treated? There is no specific cure for this condition.  Your health care provider will probably prescribe medicines to help you manage pain, recover more quickly, and avoid long-term problems. Medicines may include:  Antiviral drugs.  Anti-inflammatory drugs.  Pain medicines.  If the area involved is on your face, you may be referred to a specialist, such as an eye doctor (ophthalmologist) or an ear, nose, and throat (ENT) doctor to help you avoid eye problems, chronic pain, or disability. Follow these instructions at home: Medicines  Take medicines only as directed by your health care provider.  Apply an anti-itch or numbing cream to the affected area as directed by your health care provider. Blister and Rash Care  Take a cool bath or apply cool compresses to the area of the rash or blisters as directed by your health care provider. This may help with pain and itching.  Keep your rash covered with a loose bandage (dressing). Wear loose-fitting clothing to help ease the pain of material rubbing against the rash.  Keep your rash and blisters clean with mild soap and cool water or as directed by your health care provider.  Check your rash every day for signs of infection. These include redness, swelling, and pain that lasts or increases.  Do not pick your blisters.  Do not scratch your rash. General instructions  Rest as directed by your health care provider.  Keep all follow-up visits as directed by your health care provider. This is important.  Until your blisters scab over, your infection can cause chickenpox in people who have never had it or been vaccinated   against it. To prevent this from happening, avoid contact with other people, especially: ? Babies. ? Pregnant women. ? Children who have eczema. ? Elderly people who have transplants. ? People who have chronic illnesses, such as leukemia or AIDS. Contact a health care provider if:  Your pain is not relieved with prescribed medicines.  Your pain does not get better after  the rash heals.  Your rash looks infected. Signs of infection include redness, swelling, and pain that lasts or increases. Get help right away if:  The rash is on your face or nose.  You have facial pain, pain around your eye area, or loss of feeling on one side of your face.  You have ear pain or you have ringing in your ear.  You have loss of taste.  Your condition gets worse. This information is not intended to replace advice given to you by your health care provider. Make sure you discuss any questions you have with your health care provider. Document Released: 03/27/2005 Document Revised: 11/21/2015 Document Reviewed: 02/05/2014 Elsevier Interactive Patient Education  2017 [employer]  

## 2016-07-19 DIAGNOSIS — B028 Zoster with other complications: Secondary | ICD-10-CM | POA: Diagnosis not present

## 2016-07-19 DIAGNOSIS — H2513 Age-related nuclear cataract, bilateral: Secondary | ICD-10-CM | POA: Diagnosis not present

## 2016-07-19 DIAGNOSIS — H401131 Primary open-angle glaucoma, bilateral, mild stage: Secondary | ICD-10-CM | POA: Diagnosis not present

## 2016-07-24 ENCOUNTER — Ambulatory Visit
Admission: RE | Admit: 2016-07-24 | Discharge: 2016-07-24 | Disposition: A | Discharge status: Home / Self Care | Payer: Medicare Other | Source: Ambulatory Visit | Attending: Family Medicine | Admitting: Family Medicine

## 2016-07-24 ENCOUNTER — Ambulatory Visit (INDEPENDENT_AMBULATORY_CARE_PROVIDER_SITE_OTHER): Payer: Medicare Other | Admitting: Family Medicine

## 2016-07-24 VITALS — BP 130/80 | HR 104 | Temp 97.9°F | Ht 64.0 in | Wt 81.0 lb

## 2016-07-24 DIAGNOSIS — J439 Emphysema, unspecified: Secondary | ICD-10-CM

## 2016-07-24 DIAGNOSIS — J441 Chronic obstructive pulmonary disease with (acute) exacerbation: Secondary | ICD-10-CM

## 2016-07-24 DIAGNOSIS — R911 Solitary pulmonary nodule: Secondary | ICD-10-CM

## 2016-07-24 DIAGNOSIS — J40 Bronchitis, not specified as acute or chronic: Secondary | ICD-10-CM | POA: Insufficient documentation

## 2016-07-24 DIAGNOSIS — R05 Cough: Secondary | ICD-10-CM | POA: Diagnosis not present

## 2016-07-24 DIAGNOSIS — J432 Centrilobular emphysema: Secondary | ICD-10-CM

## 2016-07-24 DIAGNOSIS — R0602 Shortness of breath: Secondary | ICD-10-CM | POA: Diagnosis not present

## 2016-07-24 DIAGNOSIS — I700 Atherosclerosis of aorta: Secondary | ICD-10-CM | POA: Insufficient documentation

## 2016-07-24 DIAGNOSIS — I10 Essential (primary) hypertension: Secondary | ICD-10-CM

## 2016-07-24 MED ORDER — ALBUTEROL SULFATE HFA 108 (90 BASE) MCG/ACT IN AERS: 1.0000 | INHALATION_SPRAY | RESPIRATORY_TRACT | 4 refills | Status: DC | PRN

## 2016-07-24 MED ORDER — BUDESONIDE-FORMOTEROL FUMARATE 160-4.5 MCG/ACT IN AERO: 2.0000 | INHALATION_SPRAY | Freq: Every day | RESPIRATORY_TRACT | 6 refills | Status: DC

## 2016-07-24 MED ORDER — LEVOFLOXACIN 500 MG PO TABS: 500.0000 mg | ORAL_TABLET | Freq: Every day | ORAL | 0 refills | Status: DC

## 2016-07-24 MED ORDER — HYDROCHLOROTHIAZIDE 12.5 MG PO TABS: 12.5000 mg | ORAL_TABLET | Freq: Every day | ORAL | 6 refills | Status: DC

## 2016-07-24 MED ORDER — TIOTROPIUM BROMIDE MONOHYDRATE 18 MCG IN CAPS: ORAL_CAPSULE | RESPIRATORY_TRACT | 6 refills | Status: DC

## 2016-07-24 NOTE — Progress Notes (Signed)
Name: Nichole Henry   MRN: [id number]    DOB: 1945/05/10   Date:07/24/2016       Progress Note  Subjective  Chief Complaint  Chief Complaint  Patient presents with  . Bronchitis    cough and cong  . Follow-up    shingles is not better- tramadol doesn't do good- made her sick/ nauseated    Cough  This is a new problem. The current episode started in the past 7 days. The problem has been unchanged. The problem occurs constantly. The cough is productive of purulent sputum (thick yellow). Associated symptoms include chills, ear pain, a fever, headaches, myalgias, nasal congestion, postnasal drip, rhinorrhea, a sore throat, shortness of breath and wheezing. Pertinent negatives include no chest pain, ear congestion, heartburn, hemoptysis, rash, sweats or weight loss. The symptoms are aggravated by pollens. She has tried a beta-agonist inhaler, steroid inhaler and ipratropium inhaler for the symptoms. The treatment provided moderate relief. Her past medical history is significant for bronchitis and COPD. There is no history of environmental allergies.    No problem-specific Assessment & Plan notes found for this encounter.   Past Medical History:  Diagnosis Date  . COPD (chronic obstructive pulmonary disease) (HCC)   . Glaucoma   . Hypertension   . Osteoporosis     Past Surgical History:  Procedure Laterality Date  . FOOT SURGERY Right    realigned foot   . VAGINAL HYSTERECTOMY      Family History  Problem Relation Age of Onset  . Hypertension Mother   . Stroke Mother     Social History   Social History  . Marital status: Single    Spouse name: N/A  . Number of children: N/A  . Years of education: N/A   Occupational History  . Not on file.   Social History Main Topics  . Smoking status: Current Every Day Smoker  . Smokeless tobacco: Not on file  . Alcohol use No  . Drug use: No  . Sexual activity: Not Currently   Other Topics Concern  . Not on file   Social  History Narrative  . No narrative on file    No Known Allergies  Outpatient Medications Prior to Visit  Medication Sig Dispense Refill  . brinzolamide (AZOPT) 1 % ophthalmic suspension Dr Richard Miu    . dorzolamide (TRUSOPT) 2 % ophthalmic solution Place 1 drop into both eyes 2 (two) times daily. Dr Richard Miu    . latanoprost (XALATAN) 0.005 % ophthalmic solution Place 1 drop into both eyes at bedtime. Dr Richard Miu    . promethazine (PHENERGAN) 25 MG tablet Take 25 mg by mouth as needed.    Marland Kitchen albuterol (PROVENTIL HFA;VENTOLIN HFA) 108 (90 Base) MCG/ACT inhaler Inhale 1 puff into the lungs every 4 (four) hours as needed for wheezing or shortness of breath. Pt said ins will not pay for proair anymore- switch to this in place of proair 2 Inhaler 4  . budesonide-formoterol (SYMBICORT) 160-4.5 MCG/ACT inhaler Inhale 2 puffs into the lungs daily. 1 Inhaler 6  . hydrochlorothiazide (HYDRODIURIL) 12.5 MG tablet Take 1 tablet (12.5 mg total) by mouth daily. 30 tablet 6  . tiotropium (SPIRIVA HANDIHALER) 18 MCG inhalation capsule INHALE ONE CAPSULE BY MOUTH ONCE DAILY 30 capsule 6  . traMADol (ULTRAM) 50 MG tablet Take 1 tablet (50 mg total) by mouth every 8 (eight) hours as needed. (Patient not taking: Reported on 07/24/2016) 30 tablet 0  . valACYclovir (VALTREX) 1000 MG tablet Take 1 tablet (  1,000 mg total) by mouth 3 (three) times daily. 21 tablet 0   No facility-administered medications prior to visit.     Review of Systems  Constitutional: Positive for chills and fever. Negative for malaise/fatigue and weight loss.  HENT: Positive for ear pain, postnasal drip, rhinorrhea and sore throat. Negative for ear discharge.   Eyes: Negative for blurred vision.  Respiratory: Positive for cough, shortness of breath and wheezing. Negative for hemoptysis and sputum production.   Cardiovascular: Negative for chest pain, palpitations and leg swelling.  Gastrointestinal: Negative for abdominal pain, blood in  stool, constipation, diarrhea, heartburn, melena and nausea.  Genitourinary: Negative for dysuria, frequency, hematuria and urgency.  Musculoskeletal: Positive for myalgias. Negative for back pain, joint pain and neck pain.  Skin: Negative for rash.  Neurological: Positive for headaches. Negative for dizziness, tingling, sensory change and focal weakness.  Endo/Heme/Allergies: Negative for environmental allergies and polydipsia. Does not bruise/bleed easily.  Psychiatric/Behavioral: Negative for depression and suicidal ideas. The patient is not nervous/anxious and does not have insomnia.      Objective  Vitals:   07/24/16 1524  BP: 130/80  Pulse: (!) 104  Temp: 97.9 F (36.6 C)  SpO2: 98%  Weight: 81 lb (36.7 kg)  Height:  (1.626 m)    Physical Exam  Constitutional: She is well-developed, well-nourished, and in no distress. No distress.  HENT:  Head: Normocephalic and atraumatic.  Right Ear: External ear normal.  Left Ear: External ear normal.  Nose: Nose normal.  Mouth/Throat: Oropharynx is clear and moist.  Eyes: Conjunctivae and EOM are normal. Pupils are equal, round, and reactive to light. Right eye exhibits no discharge. Left eye exhibits no discharge.  Neck: Normal range of motion. Neck supple. No JVD present. No thyromegaly present.  Cardiovascular: Normal rate, regular rhythm, normal heart sounds and intact distal pulses.  Exam reveals no gallop and no friction rub.   No murmur heard. Pulmonary/Chest: Effort normal. No respiratory distress. She has wheezes. She has no rales. She exhibits no tenderness.  Abdominal: Soft. Bowel sounds are normal. She exhibits no mass. There is no tenderness. There is no guarding.  Musculoskeletal: Normal range of motion. She exhibits no edema.  Lymphadenopathy:    She has no cervical adenopathy.  Neurological: She is alert. She has normal reflexes.  Skin: Skin is warm and dry. She is not diaphoretic.  Psychiatric: Mood and affect  normal.  Nursing note and vitals reviewed.     Assessment & Plan  Problem List Items Addressed This Visit      Cardiovascular and Mediastinum   Essential hypertension   Relevant Medications   hydrochlorothiazide (HYDRODIURIL) 12.5 MG tablet     Respiratory   Centrilobular emphysema (HCC)   Relevant Medications   budesonide-formoterol (SYMBICORT) 160-4.5 MCG/ACT inhaler   tiotropium (SPIRIVA HANDIHALER) 18 MCG inhalation capsule   albuterol (PROVENTIL HFA;VENTOLIN HFA) 108 (90 Base) MCG/ACT inhaler   Chronic obstructive pulmonary disease with acute exacerbation (HCC)   Relevant Medications   budesonide-formoterol (SYMBICORT) 160-4.5 MCG/ACT inhaler   tiotropium (SPIRIVA HANDIHALER) 18 MCG inhalation capsule   albuterol (PROVENTIL HFA;VENTOLIN HFA) 108 (90 Base) MCG/ACT inhaler     Other   Pulmonary nodule   Relevant Orders   DG Chest 2 View   Ambulatory referral to Pulmonology    Other Visit Diagnoses    Bronchitis    -  Primary   Relevant Medications   levofloxacin (LEVAQUIN) 500 MG tablet   Other Relevant Orders   DG Chest  2 View   Pulmonary emphysema, unspecified emphysema type (HCC)       Relevant Medications   budesonide-formoterol (SYMBICORT) 160-4.5 MCG/ACT inhaler   tiotropium (SPIRIVA HANDIHALER) 18 MCG inhalation capsule   albuterol (PROVENTIL HFA;VENTOLIN HFA) 108 (90 Base) MCG/ACT inhaler   Other Relevant Orders   DG Chest 2 View   Ambulatory referral to Pulmonology      Meds ordered this encounter  Medications  . budesonide-formoterol (SYMBICORT) 160-4.5 MCG/ACT inhaler    Sig: Inhale 2 puffs into the lungs daily.    Dispense:  1 Inhaler    Refill:  6    Change Rx to 2 puffs  . tiotropium (SPIRIVA HANDIHALER) 18 MCG inhalation capsule    Sig: INHALE ONE CAPSULE BY MOUTH ONCE DAILY    Dispense:  30 capsule    Refill:  6  . albuterol (PROVENTIL HFA;VENTOLIN HFA) 108 (90 Base) MCG/ACT inhaler    Sig: Inhale 1 puff into the lungs every 4 (four)  hours as needed for wheezing or shortness of breath. Pt said ins will not pay for proair anymore- switch to this in place of proair    Dispense:  2 Inhaler    Refill:  4  . hydrochlorothiazide (HYDRODIURIL) 12.5 MG tablet    Sig: Take 1 tablet (12.5 mg total) by mouth daily.    Dispense:  30 tablet    Refill:  6  . levofloxacin (LEVAQUIN) 500 MG tablet    Sig: Take 1 tablet (500 mg total) by mouth daily.    Dispense:  7 tablet    Refill:  0      Dr. Hayden [facility]  07/24/16

## 2016-07-27 ENCOUNTER — Other Ambulatory Visit: Payer: Self-pay

## 2016-08-10 DIAGNOSIS — R0609 Other forms of dyspnea: Secondary | ICD-10-CM | POA: Diagnosis not present

## 2016-08-10 DIAGNOSIS — Z720 Tobacco use: Secondary | ICD-10-CM | POA: Diagnosis not present

## 2016-08-10 DIAGNOSIS — R911 Solitary pulmonary nodule: Secondary | ICD-10-CM | POA: Diagnosis not present

## 2016-08-10 DIAGNOSIS — R634 Abnormal weight loss: Secondary | ICD-10-CM | POA: Diagnosis not present

## 2016-08-10 DIAGNOSIS — J439 Emphysema, unspecified: Secondary | ICD-10-CM | POA: Diagnosis not present

## 2016-08-11 DIAGNOSIS — Z720 Tobacco use: Secondary | ICD-10-CM | POA: Diagnosis not present

## 2016-08-11 DIAGNOSIS — R911 Solitary pulmonary nodule: Secondary | ICD-10-CM | POA: Diagnosis not present

## 2016-08-11 DIAGNOSIS — R634 Abnormal weight loss: Secondary | ICD-10-CM | POA: Diagnosis not present

## 2016-08-11 DIAGNOSIS — J439 Emphysema, unspecified: Secondary | ICD-10-CM | POA: Diagnosis not present

## 2016-08-11 DIAGNOSIS — R0609 Other forms of dyspnea: Secondary | ICD-10-CM | POA: Diagnosis not present

## 2016-09-25 DIAGNOSIS — J449 Chronic obstructive pulmonary disease, unspecified: Secondary | ICD-10-CM | POA: Diagnosis not present

## 2016-09-25 DIAGNOSIS — Z720 Tobacco use: Secondary | ICD-10-CM | POA: Diagnosis not present

## 2016-09-25 DIAGNOSIS — R0602 Shortness of breath: Secondary | ICD-10-CM | POA: Diagnosis not present

## 2016-09-25 DIAGNOSIS — R911 Solitary pulmonary nodule: Secondary | ICD-10-CM | POA: Diagnosis not present

## 2016-11-10 DIAGNOSIS — H401131 Primary open-angle glaucoma, bilateral, mild stage: Secondary | ICD-10-CM | POA: Diagnosis not present

## 2016-11-10 DIAGNOSIS — H2513 Age-related nuclear cataract, bilateral: Secondary | ICD-10-CM | POA: Diagnosis not present

## 2016-11-10 DIAGNOSIS — H5010 Unspecified exotropia: Secondary | ICD-10-CM | POA: Diagnosis not present

## 2016-12-14 DIAGNOSIS — J439 Emphysema, unspecified: Secondary | ICD-10-CM | POA: Diagnosis not present

## 2016-12-14 DIAGNOSIS — I10 Essential (primary) hypertension: Secondary | ICD-10-CM | POA: Diagnosis not present

## 2016-12-14 DIAGNOSIS — K219 Gastro-esophageal reflux disease without esophagitis: Secondary | ICD-10-CM | POA: Diagnosis not present

## 2016-12-14 DIAGNOSIS — Z01818 Encounter for other preprocedural examination: Secondary | ICD-10-CM | POA: Diagnosis not present

## 2016-12-28 DIAGNOSIS — H2512 Age-related nuclear cataract, left eye: Secondary | ICD-10-CM | POA: Diagnosis not present

## 2016-12-28 DIAGNOSIS — H401121 Primary open-angle glaucoma, left eye, mild stage: Secondary | ICD-10-CM | POA: Diagnosis not present

## 2016-12-28 DIAGNOSIS — K219 Gastro-esophageal reflux disease without esophagitis: Secondary | ICD-10-CM | POA: Diagnosis not present

## 2016-12-28 DIAGNOSIS — F17200 Nicotine dependence, unspecified, uncomplicated: Secondary | ICD-10-CM | POA: Diagnosis not present

## 2016-12-28 DIAGNOSIS — I10 Essential (primary) hypertension: Secondary | ICD-10-CM | POA: Diagnosis not present

## 2016-12-28 DIAGNOSIS — M810 Age-related osteoporosis without current pathological fracture: Secondary | ICD-10-CM | POA: Diagnosis not present

## 2016-12-28 DIAGNOSIS — J449 Chronic obstructive pulmonary disease, unspecified: Secondary | ICD-10-CM | POA: Diagnosis not present

## 2016-12-28 DIAGNOSIS — Z7951 Long term (current) use of inhaled steroids: Secondary | ICD-10-CM | POA: Diagnosis not present

## 2016-12-28 DIAGNOSIS — H401131 Primary open-angle glaucoma, bilateral, mild stage: Secondary | ICD-10-CM | POA: Diagnosis not present

## 2017-01-11 DIAGNOSIS — M810 Age-related osteoporosis without current pathological fracture: Secondary | ICD-10-CM | POA: Diagnosis not present

## 2017-01-11 DIAGNOSIS — H2511 Age-related nuclear cataract, right eye: Secondary | ICD-10-CM | POA: Diagnosis not present

## 2017-01-11 DIAGNOSIS — F17210 Nicotine dependence, cigarettes, uncomplicated: Secondary | ICD-10-CM | POA: Diagnosis not present

## 2017-01-11 DIAGNOSIS — Z7951 Long term (current) use of inhaled steroids: Secondary | ICD-10-CM | POA: Diagnosis not present

## 2017-01-11 DIAGNOSIS — J449 Chronic obstructive pulmonary disease, unspecified: Secondary | ICD-10-CM | POA: Diagnosis not present

## 2017-01-11 DIAGNOSIS — R000 Tachycardia, unspecified: Secondary | ICD-10-CM | POA: Diagnosis not present

## 2017-01-11 DIAGNOSIS — H401131 Primary open-angle glaucoma, bilateral, mild stage: Secondary | ICD-10-CM | POA: Diagnosis not present

## 2017-01-11 DIAGNOSIS — K219 Gastro-esophageal reflux disease without esophagitis: Secondary | ICD-10-CM | POA: Diagnosis not present

## 2017-01-11 DIAGNOSIS — H401111 Primary open-angle glaucoma, right eye, mild stage: Secondary | ICD-10-CM | POA: Diagnosis not present

## 2017-01-11 DIAGNOSIS — Z79899 Other long term (current) drug therapy: Secondary | ICD-10-CM | POA: Diagnosis not present

## 2017-01-11 DIAGNOSIS — I10 Essential (primary) hypertension: Secondary | ICD-10-CM | POA: Diagnosis not present

## 2017-01-23 ENCOUNTER — Ambulatory Visit (INDEPENDENT_AMBULATORY_CARE_PROVIDER_SITE_OTHER): Payer: Medicare Other | Admitting: Family Medicine

## 2017-01-23 ENCOUNTER — Encounter: Payer: Self-pay | Admitting: Family Medicine

## 2017-01-23 VITALS — BP 130/88 | HR 88 | Ht 64.0 in | Wt 77.0 lb

## 2017-01-23 DIAGNOSIS — J439 Emphysema, unspecified: Secondary | ICD-10-CM

## 2017-01-23 DIAGNOSIS — B0222 Postherpetic trigeminal neuralgia: Secondary | ICD-10-CM | POA: Diagnosis not present

## 2017-01-23 DIAGNOSIS — I10 Essential (primary) hypertension: Secondary | ICD-10-CM | POA: Diagnosis not present

## 2017-01-23 DIAGNOSIS — J441 Chronic obstructive pulmonary disease with (acute) exacerbation: Secondary | ICD-10-CM | POA: Diagnosis not present

## 2017-01-23 DIAGNOSIS — J0100 Acute maxillary sinusitis, unspecified: Secondary | ICD-10-CM

## 2017-01-23 DIAGNOSIS — J432 Centrilobular emphysema: Secondary | ICD-10-CM | POA: Diagnosis not present

## 2017-01-23 DIAGNOSIS — Z87898 Personal history of other specified conditions: Secondary | ICD-10-CM

## 2017-01-23 MED ORDER — BUDESONIDE-FORMOTEROL FUMARATE 160-4.5 MCG/ACT IN AERO: 2.0000 | INHALATION_SPRAY | Freq: Every day | RESPIRATORY_TRACT | 6 refills | Status: AC

## 2017-01-23 MED ORDER — HYDROCHLOROTHIAZIDE 12.5 MG PO CAPS: 12.5000 mg | ORAL_CAPSULE | Freq: Every day | ORAL | 1 refills | Status: DC

## 2017-01-23 MED ORDER — AZITHROMYCIN 250 MG PO TABS: ORAL_TABLET | ORAL | 0 refills | Status: AC

## 2017-01-23 MED ORDER — TIOTROPIUM BROMIDE MONOHYDRATE 18 MCG IN CAPS: ORAL_CAPSULE | RESPIRATORY_TRACT | 6 refills | Status: AC

## 2017-01-23 MED ORDER — ALBUTEROL SULFATE HFA 108 (90 BASE) MCG/ACT IN AERS: 1.0000 | INHALATION_SPRAY | RESPIRATORY_TRACT | 6 refills | Status: AC | PRN

## 2017-01-23 MED ORDER — AMOXICILLIN 500 MG PO CAPS: 500.0000 mg | ORAL_CAPSULE | Freq: Three times a day (TID) | ORAL | 0 refills | Status: DC

## 2017-01-23 NOTE — Patient Instructions (Signed)
Postherpetic Neuralgia Postherpetic neuralgia (PHN) is nerve pain that occurs after a shingles infection. Shingles is a painful rash that appears on one side of the body, usually on your trunk or face. Shingles is caused by the varicella-zoster virus. This is the same virus that causes chickenpox. In people who have had chickenpox, the virus can resurface years later and cause shingles. You may have PHN if you continue to have pain for 3 months after your shingles rash has gone away. PHN appears in the same area where you had the shingles rash. For most people, PHN goes away within 1 year. Getting a vaccination for shingles can prevent PHN. This vaccine is recommended for people older than 50. It may prevent shingles and may also lower your risk of PHN if you do get shingles. What are the causes? PHN is caused by damage to your nerves from the varicella-zoster virus. This damage makes your nerves overly sensitive. What increases the risk? Aging is the biggest risk factor for developing PHN. Most people who get PHN are older than 60. Other risk factors include:  Having very bad pain before your shingles rash starts.  Having a very bad rash.  Having shingles in the nerve that supplies your face and eye (trigeminal nerve).  What are the signs or symptoms? Pain is the main symptom of PHN. The pain is often very bad and may be described as stabbing, burning, or feeling like an electric shock. The pain may come and go or may be there all the time. Pain may be triggered by light touches on the skin or changes in temperature. You may have itching along with the pain. How is this diagnosed? Your health care provider may diagnose PHN based on your symptoms and your history of shingles. Lab studies and other diagnostic tests are usually not needed. How is this treated? There is no cure for PHN. Treatment for PHN will focus on pain relief. Over-the-counter pain relievers do not usually relieve PHN pain. You  may need to work with a pain specialist. Treatment may include:  Antidepressant medicines to help with pain and improve sleep.  Antiseizure medicines to relieve nerve pain.  Strong pain relievers (opioids).  A numbing patch worn on the skin (lidocaine patch).  Follow these instructions at home: It may take a long time to recover from PHN. Work closely with your health care provider, and have a good support system at home.  Take all medicines as directed by your health care provider.  Wear loose, comfortable clothing.  Cover sensitive areas with a dressing to reduce friction from clothing rubbing on the area.  If cold does not make your pain worse, try applying a cool compress or cooling gel pack to the area.  Talk to your health care provider if you feel depressed or desperate. Living with long-term pain can be depressing.  Contact a health care provider if:  Your medicine is not helping.  You are struggling to manage your pain at home. This information is not intended to replace advice given to you by your health care provider. Make sure you discuss any questions you have with your health care provider. Document Released: 06/17/2002 Document Revised: 09/02/2015 Document Reviewed: 03/18/2013 Elsevier Interactive Patient Education  2018 [employer]  

## 2017-01-23 NOTE — Progress Notes (Signed)
Name: Nichole Henry   MRN: [id number]    DOB: 06-Oct-1945   Date:01/23/2017       Progress Note  Subjective  Chief Complaint  Chief Complaint  Patient presents with  . Hypertension  . COPD    Hypertension  This is a chronic problem. The current episode started more than 1 year ago. The problem has been waxing and waning since onset. The problem is controlled. Associated symptoms include shortness of breath. Pertinent negatives include no anxiety, blurred vision, chest pain, headaches, malaise/fatigue, neck pain, orthopnea, palpitations, peripheral edema, PND or sweats. There are no associated agents to hypertension. There are no known Henry factors for coronary artery disease. Past treatments include diuretics. The current treatment provides mild improvement. There are no compliance problems.  There is no history of angina, kidney disease, CAD/MI, CVA, heart failure, left ventricular hypertrophy, PVD or retinopathy. There is no history of chronic renal disease, a hypertension causing med or renovascular disease.  Breathing Problem  She complains of cough, difficulty breathing, hoarse voice, shortness of breath and wheezing. There is no chest tightness, frequent throat clearing, hemoptysis or sputum production. This is a recurrent problem. The current episode started more than 1 year ago. The problem occurs intermittently. The problem has been gradually improving. The cough is productive of sputum and productive of purulent sputum. Associated symptoms include nasal congestion, postnasal drip, a sore throat and weight loss. Pertinent negatives include no chest pain, ear pain, fever, headaches, heartburn, malaise/fatigue, myalgias, PND or sweats. She reports moderate improvement on treatment. Her symptoms are not alleviated by steroid inhaler and beta-agonist. Henry factors for lung disease include smoking/tobacco exposure. Her past medical history is significant for COPD and emphysema.    No  problem-specific Assessment & Plan notes found for this encounter.   Past Medical History:  Diagnosis Date  . COPD (chronic obstructive pulmonary disease) (HCC)   . Glaucoma   . Hypertension   . Osteoporosis     Past Surgical History:  Procedure Laterality Date  . FOOT SURGERY Right    realigned foot   . VAGINAL HYSTERECTOMY      Family History  Problem Relation Age of Onset  . Hypertension Mother   . Stroke Mother     Social History   Social History  . Marital status: Single    Spouse name: N/A  . Number of children: N/A  . Years of education: N/A   Occupational History  . Not on file.   Social History Main Topics  . Smoking status: Current Every Day Smoker  . Smokeless tobacco: Never Used  . Alcohol use No  . Drug use: No  . Sexual activity: Not Currently   Other Topics Concern  . Not on file   Social History Narrative  . No narrative on file    No Known Allergies  Outpatient Medications Prior to Visit  Medication Sig Dispense Refill  . albuterol (PROVENTIL HFA;VENTOLIN HFA) 108 (90 Base) MCG/ACT inhaler Inhale 1 puff into the lungs every 4 (four) hours as needed for wheezing or shortness of breath. Pt said ins will not pay for proair anymore- switch to this in place of proair 2 Inhaler 4  . budesonide-formoterol (SYMBICORT) 160-4.5 MCG/ACT inhaler Inhale 2 puffs into the lungs daily. 1 Inhaler 6  . hydrochlorothiazide (HYDRODIURIL) 12.5 MG tablet Take 1 tablet (12.5 mg total) by mouth daily. 30 tablet 6  . tiotropium (SPIRIVA HANDIHALER) 18 MCG inhalation capsule INHALE ONE CAPSULE BY MOUTH ONCE DAILY 30  capsule 6  . brinzolamide (AZOPT) 1 % ophthalmic suspension Dr Richard Miu    . dorzolamide (TRUSOPT) 2 % ophthalmic solution Place 1 drop into both eyes 2 (two) times daily. Dr Richard Miu    . latanoprost (XALATAN) 0.005 % ophthalmic solution Place 1 drop into both eyes at bedtime. Dr Richard Miu    . traMADol (ULTRAM) 50 MG tablet Take 1 tablet (50 mg total)  by mouth every 8 (eight) hours as needed. (Patient not taking: Reported on 07/24/2016) 30 tablet 0  . levofloxacin (LEVAQUIN) 500 MG tablet Take 1 tablet (500 mg total) by mouth daily. 7 tablet 0  . promethazine (PHENERGAN) 25 MG tablet Take 25 mg by mouth as needed.     No facility-administered medications prior to visit.     Review of Systems  Constitutional: Positive for weight loss. Negative for chills, fever and malaise/fatigue.  HENT: Positive for hoarse voice, postnasal drip and sore throat. Negative for ear discharge and ear pain.   Eyes: Negative for blurred vision.  Respiratory: Positive for cough, shortness of breath and wheezing. Negative for hemoptysis and sputum production.   Cardiovascular: Negative for chest pain, palpitations, orthopnea, leg swelling and PND.  Gastrointestinal: Negative for abdominal pain, blood in stool, constipation, diarrhea, heartburn, melena and nausea.  Genitourinary: Negative for dysuria, frequency, hematuria and urgency.  Musculoskeletal: Negative for back pain, joint pain, myalgias and neck pain.  Skin: Negative for rash.  Neurological: Negative for dizziness, tingling, sensory change, focal weakness and headaches.  Endo/Heme/Allergies: Negative for environmental allergies and polydipsia. Does not bruise/bleed easily.  Psychiatric/Behavioral: Negative for depression and suicidal ideas. The patient is not nervous/anxious and does not have insomnia.      Objective  Vitals:   01/23/17 1443  BP: 130/88  Pulse: 88  Weight: 77 lb (34.9 kg)  Height:  (1.626 m)    Physical Exam  Constitutional: She is well-developed, well-nourished, and in no distress. No distress.  HENT:  Head: Normocephalic and atraumatic.  Right Ear: External ear normal.  Left Ear: External ear normal.  Nose: Nose normal.  Mouth/Throat: Oropharynx is clear and moist.  Eyes: Pupils are equal, round, and reactive to light. Conjunctivae and EOM are normal. Right eye  exhibits no discharge. Left eye exhibits no discharge.  Neck: Normal range of motion. Neck supple. No JVD present. No thyromegaly present.  Cardiovascular: Normal rate, regular rhythm, normal heart sounds and intact distal pulses.  Exam reveals no gallop and no friction rub.   No murmur heard. Pulmonary/Chest: Effort normal and breath sounds normal. She has no wheezes. She has no rales.  Abdominal: Soft. Bowel sounds are normal. She exhibits no mass. There is no tenderness. There is no guarding.  Musculoskeletal: Normal range of motion. She exhibits no edema.  Lymphadenopathy:    She has no cervical adenopathy.  Neurological: She is alert. She has normal reflexes.  Skin: Skin is warm and dry. She is not diaphoretic.  Psychiatric: Mood and affect normal.  Nursing note and vitals reviewed.     Assessment & Plan  Problem List Items Addressed This Visit      Cardiovascular and Mediastinum   Essential hypertension - Primary   Relevant Medications   hydrochlorothiazide (MICROZIDE) 12.5 MG capsule     Respiratory   Centrilobular emphysema (HCC)   Relevant Medications   budesonide-formoterol (SYMBICORT) 160-4.5 MCG/ACT inhaler   tiotropium (SPIRIVA HANDIHALER) 18 MCG inhalation capsule   albuterol (PROVENTIL HFA;VENTOLIN HFA) 108 (90 Base) MCG/ACT inhaler   azithromycin (ZITHROMAX)  250 MG tablet   Chronic obstructive pulmonary disease with acute exacerbation (HCC)   Relevant Medications   budesonide-formoterol (SYMBICORT) 160-4.5 MCG/ACT inhaler   tiotropium (SPIRIVA HANDIHALER) 18 MCG inhalation capsule   albuterol (PROVENTIL HFA;VENTOLIN HFA) 108 (90 Base) MCG/ACT inhaler   azithromycin (ZITHROMAX) 250 MG tablet   Pulmonary emphysema (HCC)   Relevant Medications   budesonide-formoterol (SYMBICORT) 160-4.5 MCG/ACT inhaler   tiotropium (SPIRIVA HANDIHALER) 18 MCG inhalation capsule   albuterol (PROVENTIL HFA;VENTOLIN HFA) 108 (90 Base) MCG/ACT inhaler   azithromycin (ZITHROMAX)  250 MG tablet     Nervous and Auditory   Post-herpetic trigeminal neuralgia   Relevant Orders   Ambulatory referral to Neurology     Other   History of weight loss    Other Visit Diagnoses    Acute maxillary sinusitis, recurrence not specified       Relevant Medications   azithromycin (ZITHROMAX) 250 MG tablet      Meds ordered this encounter  Medications  . budesonide-formoterol (SYMBICORT) 160-4.5 MCG/ACT inhaler    Sig: Inhale 2 puffs into the lungs daily.    Dispense:  1 Inhaler    Refill:  6    Change Rx to 2 puffs  . tiotropium (SPIRIVA HANDIHALER) 18 MCG inhalation capsule    Sig: INHALE ONE CAPSULE BY MOUTH ONCE DAILY    Dispense:  30 capsule    Refill:  6  . albuterol (PROVENTIL HFA;VENTOLIN HFA) 108 (90 Base) MCG/ACT inhaler    Sig: Inhale 1 puff into the lungs every 4 (four) hours as needed for wheezing or shortness of breath. Pt said ins will not pay for proair anymore- switch to this in place of proair    Dispense:  2 Inhaler    Refill:  6  . hydrochlorothiazide (MICROZIDE) 12.5 MG capsule    Sig: Take 1 capsule (12.5 mg total) by mouth daily.    Dispense:  90 capsule    Refill:  1  . DISCONTD: amoxicillin (AMOXIL) 500 MG capsule    Sig: Take 1 capsule (500 mg total) by mouth 3 (three) times daily.    Dispense:  30 capsule    Refill:  0  . azithromycin (ZITHROMAX) 250 MG tablet    Sig: 2 today tyhen 1 a day for 4 days    Dispense:  6 tablet    Refill:  0      Dr. Hayden [facility]  01/23/17

## 2017-02-22 DIAGNOSIS — B0229 Other postherpetic nervous system involvement: Secondary | ICD-10-CM | POA: Diagnosis not present

## 2017-02-22 DIAGNOSIS — R51 Headache: Secondary | ICD-10-CM | POA: Diagnosis not present

## 2017-03-22 DIAGNOSIS — B0229 Other postherpetic nervous system involvement: Secondary | ICD-10-CM | POA: Diagnosis not present

## 2017-03-26 IMAGING — CR DG CHEST 2V
2 series · 2 of 2 positions shown · non-contrast
Comparison: 09/04/2013.  09/15/2008.

CLINICAL DATA: Shortness of breath and congestion.

EXAM:
CHEST  2 VIEW

[chest pa]
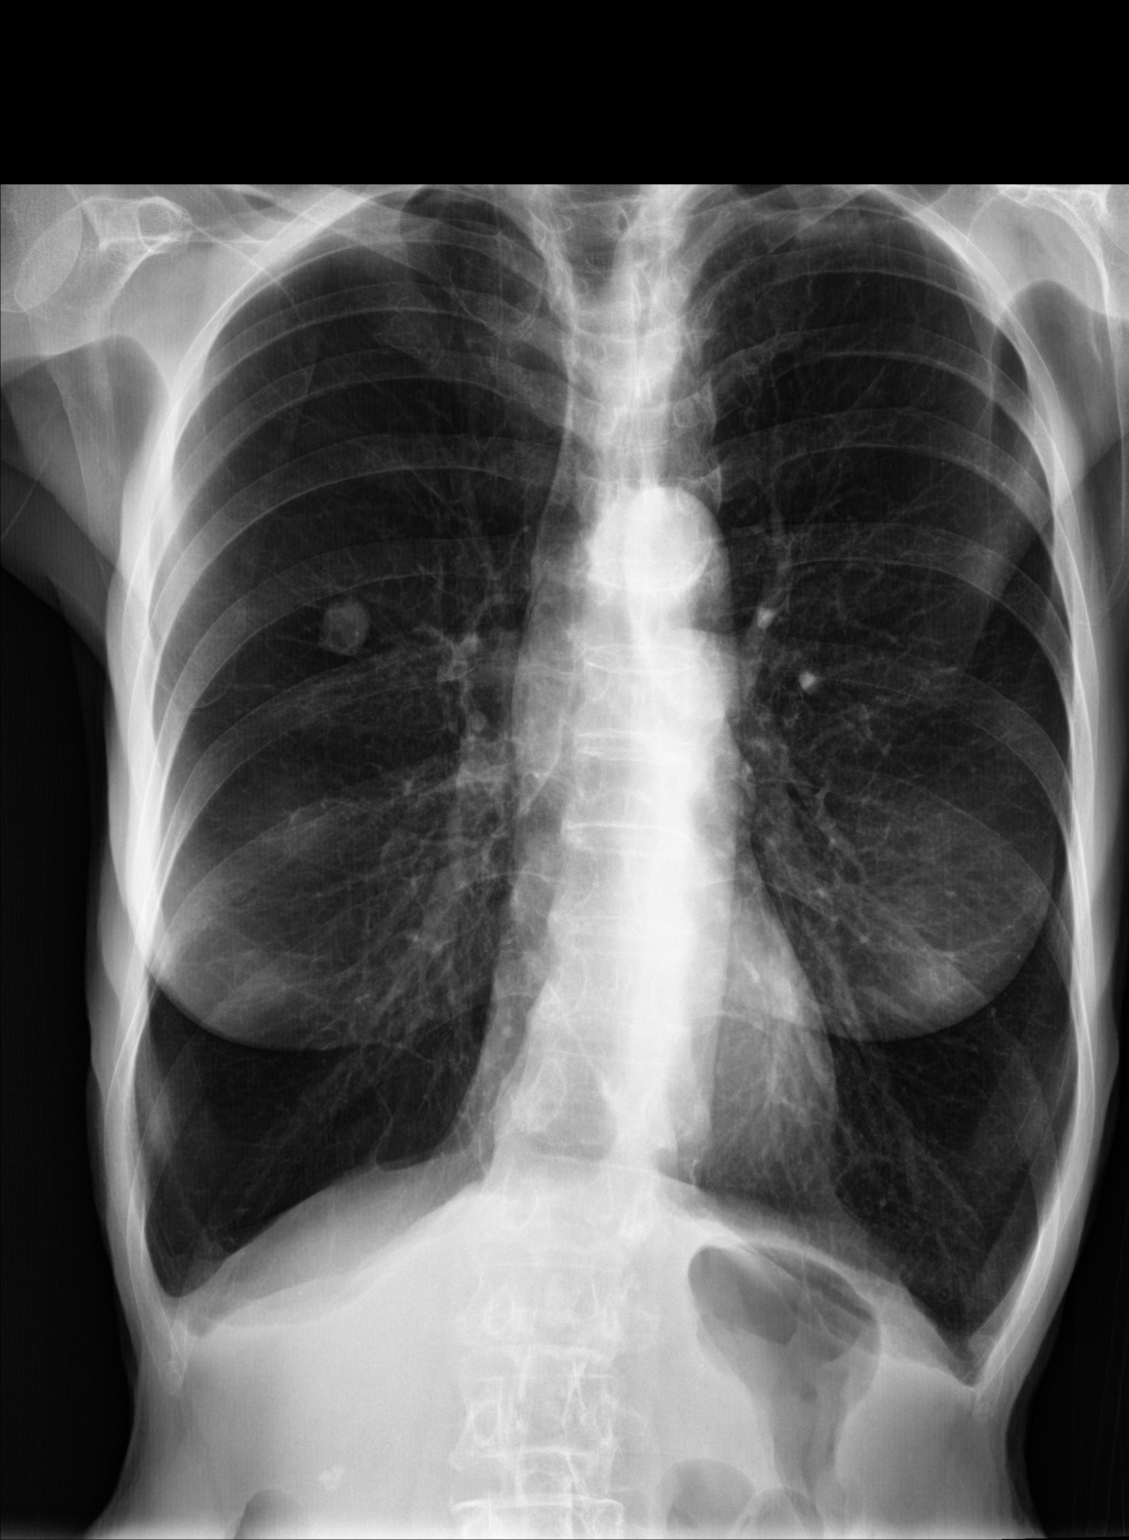

[chest lat]
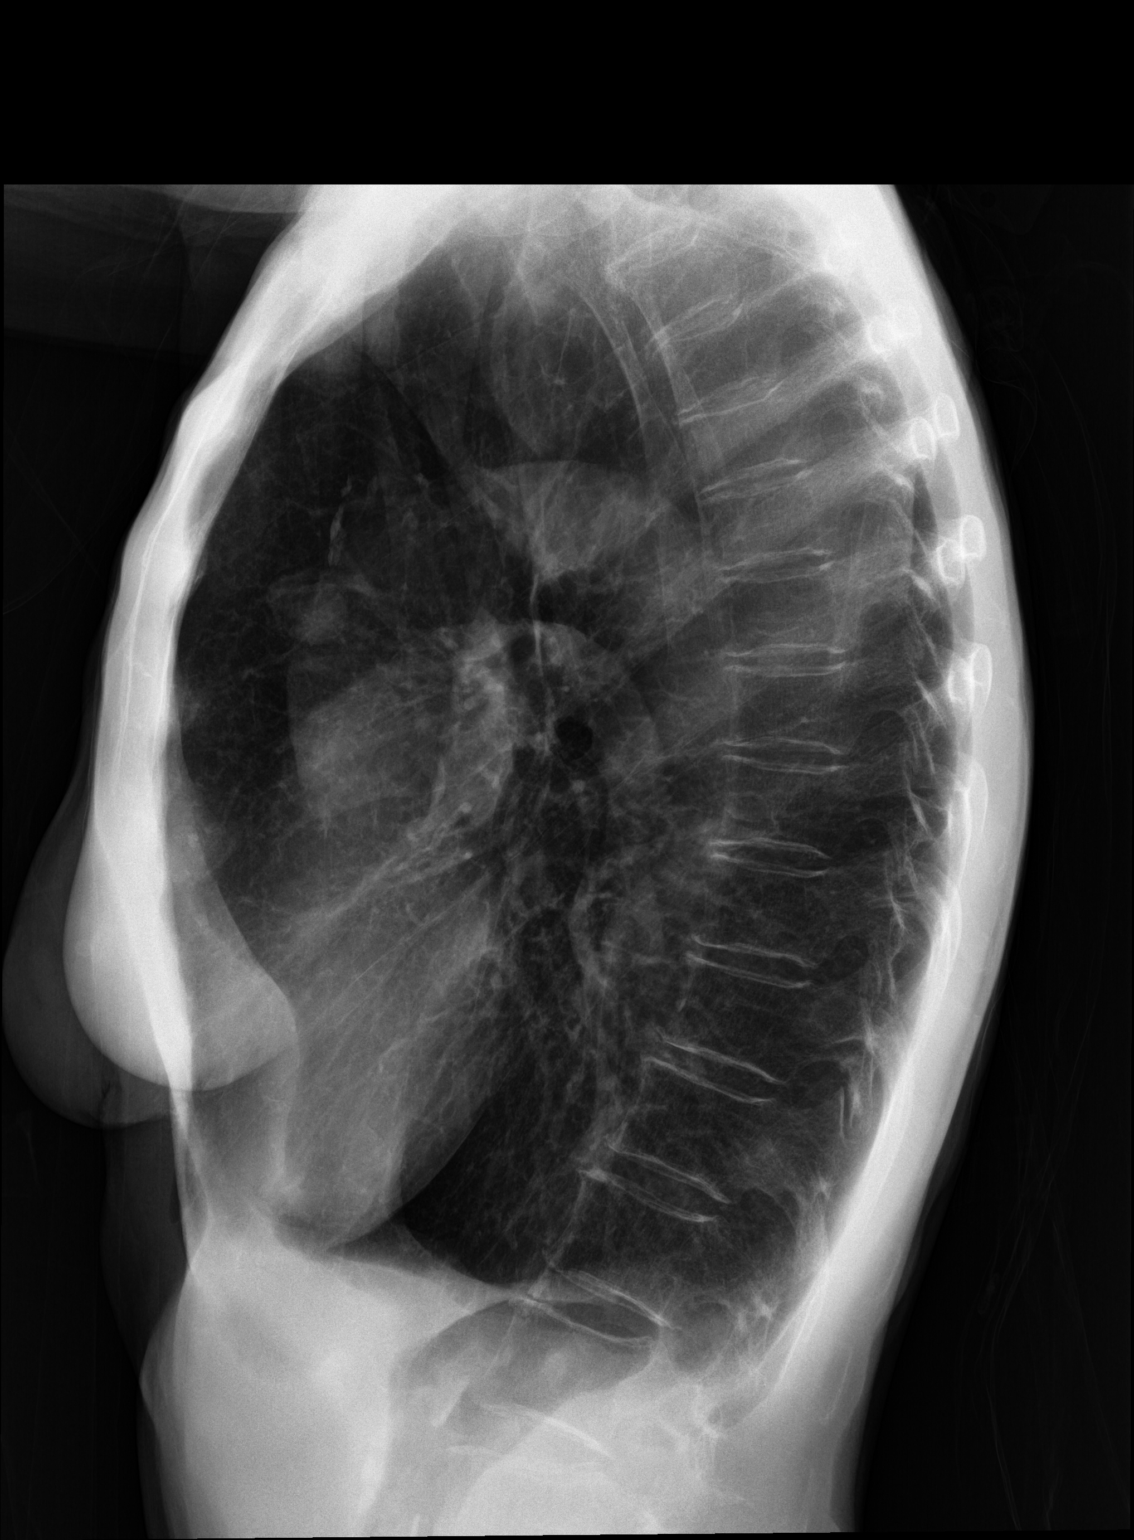

[2 of 2 positions shown; findings below may reference images not displayed]

FINDINGS: Mediastinum hilar structures are normal. Stable nodular density
noted right upper lung. Lungs are clear of acute infiltrates. COPD.
Heart size normal. Stable pleural thickening.
IMPRESSION: 1. Stable nodular lesion right upper lung.

2. No acute pulmonary disease.  COPD.

## 2017-07-16 DIAGNOSIS — Z79899 Other long term (current) drug therapy: Secondary | ICD-10-CM | POA: Diagnosis not present

## 2017-07-16 DIAGNOSIS — Z7689 Persons encountering health services in other specified circumstances: Secondary | ICD-10-CM | POA: Diagnosis not present

## 2017-07-16 DIAGNOSIS — I10 Essential (primary) hypertension: Secondary | ICD-10-CM | POA: Diagnosis not present

## 2017-07-16 DIAGNOSIS — J449 Chronic obstructive pulmonary disease, unspecified: Secondary | ICD-10-CM | POA: Diagnosis not present

## 2017-07-18 DIAGNOSIS — Z961 Presence of intraocular lens: Secondary | ICD-10-CM | POA: Diagnosis not present

## 2017-07-18 DIAGNOSIS — H401131 Primary open-angle glaucoma, bilateral, mild stage: Secondary | ICD-10-CM | POA: Diagnosis not present

## 2017-08-07 ENCOUNTER — Other Ambulatory Visit: Payer: Self-pay | Admitting: Family Medicine

## 2017-08-07 DIAGNOSIS — I10 Essential (primary) hypertension: Secondary | ICD-10-CM

## 2017-09-04 DIAGNOSIS — H903 Sensorineural hearing loss, bilateral: Secondary | ICD-10-CM | POA: Diagnosis not present

## 2017-09-04 DIAGNOSIS — H6123 Impacted cerumen, bilateral: Secondary | ICD-10-CM | POA: Diagnosis not present

## 2017-09-04 DIAGNOSIS — H9209 Otalgia, unspecified ear: Secondary | ICD-10-CM | POA: Diagnosis not present

## 2017-09-04 DIAGNOSIS — H9319 Tinnitus, unspecified ear: Secondary | ICD-10-CM | POA: Diagnosis not present

## 2017-10-09 DIAGNOSIS — R0609 Other forms of dyspnea: Secondary | ICD-10-CM | POA: Diagnosis not present

## 2017-10-09 DIAGNOSIS — R05 Cough: Secondary | ICD-10-CM | POA: Diagnosis not present

## 2017-10-09 DIAGNOSIS — J449 Chronic obstructive pulmonary disease, unspecified: Secondary | ICD-10-CM | POA: Diagnosis not present

## 2017-12-11 DIAGNOSIS — Z961 Presence of intraocular lens: Secondary | ICD-10-CM | POA: Diagnosis not present

## 2017-12-11 DIAGNOSIS — H401131 Primary open-angle glaucoma, bilateral, mild stage: Secondary | ICD-10-CM | POA: Diagnosis not present

## 2017-12-17 ENCOUNTER — Telehealth: Payer: Self-pay

## 2017-12-17 NOTE — Telephone Encounter (Signed)
Left VM. Needs to make appt for F/U HTN and COPD per [facility] in care. Put Note by Appt stating See Asher Muir for August Gaps

## 2018-02-12 DIAGNOSIS — J449 Chronic obstructive pulmonary disease, unspecified: Secondary | ICD-10-CM | POA: Diagnosis not present

## 2018-02-12 DIAGNOSIS — R05 Cough: Secondary | ICD-10-CM | POA: Diagnosis not present

## 2018-02-12 DIAGNOSIS — R0602 Shortness of breath: Secondary | ICD-10-CM | POA: Diagnosis not present

## 2018-04-11 IMAGING — CR DG CHEST 2V
2 series · 2 of 2 positions shown · non-contrast
Comparison: 07/09/2015

CLINICAL DATA: Shortness of breath with exertion and productive
cough for 1 week. Fever and chills. Current smoker. History of COPD
and bronchitis. Known spot in the right lung since 6331.

EXAM:
CHEST  2 VIEW

[chest pa]
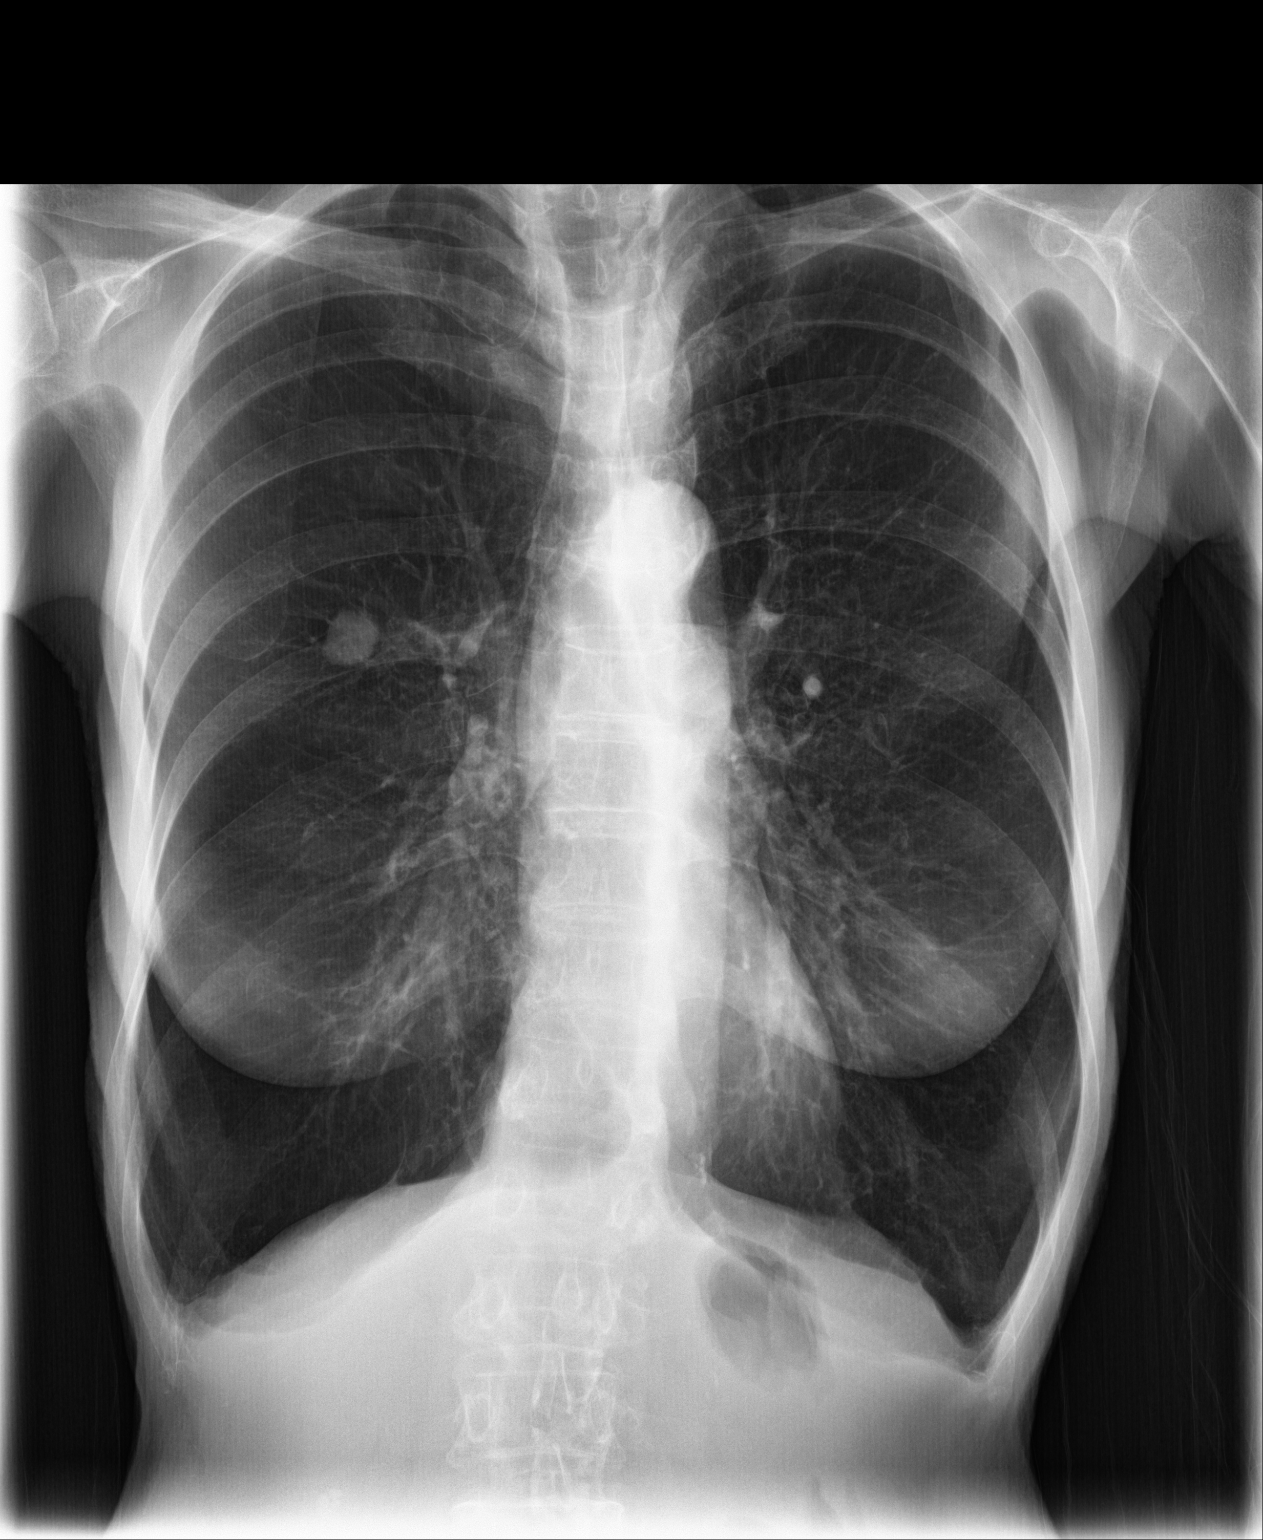

[chest lat]
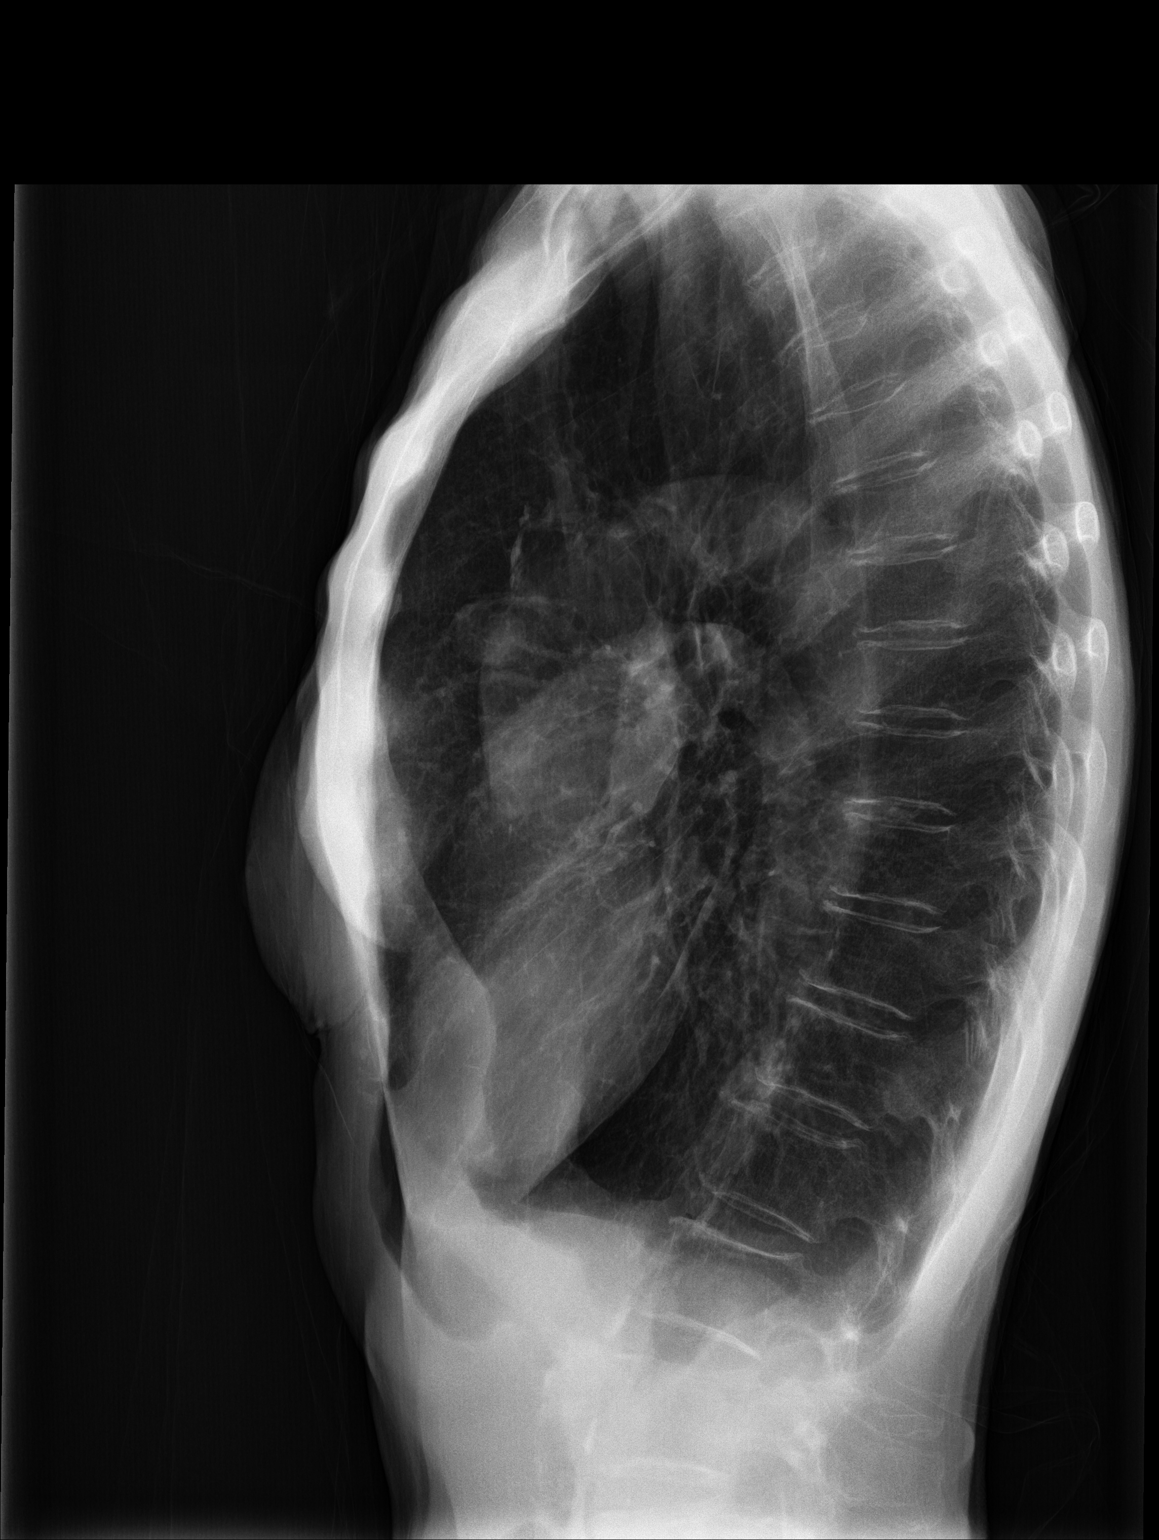

[2 of 2 positions shown; findings below may reference images not displayed]

FINDINGS: 1.5 cm diameter circumscribed nodule in the right mid lung. This is
been present on images dating back to 01/24/2007. No new nodules
identified. Diffuse emphysematous changes throughout the lungs.
Peribronchial thickening and central interstitial changes consistent
with chronic bronchitis. No focal airspace disease or consolidation
in the lungs. No blunting of costophrenic angles. No pneumothorax.
Heart size and pulmonary vascularity are normal. Mediastinal
contours appear intact. Calcification of the aorta.
IMPRESSION: Prominent emphysematous changes and chronic bronchitic changes in
the lungs. Right mid lung nodule has been present since 3114 and is
likely benign.
